# Patient Record
Sex: Female | Born: 1975 | Race: Black or African American | Hispanic: No | Marital: Married | Smoking: Never smoker
Health system: Southern US, Community
[De-identification: ages and names within clinical notes are randomized; demographics above are authoritative.]

## PROBLEM LIST (undated history)

## (undated) DIAGNOSIS — K219 Gastro-esophageal reflux disease without esophagitis: Secondary | ICD-10-CM

## (undated) DIAGNOSIS — R202 Paresthesia of skin: Secondary | ICD-10-CM

## (undated) DIAGNOSIS — I10 Essential (primary) hypertension: Secondary | ICD-10-CM

## (undated) DIAGNOSIS — J309 Allergic rhinitis, unspecified: Secondary | ICD-10-CM

## (undated) DIAGNOSIS — J45909 Unspecified asthma, uncomplicated: Secondary | ICD-10-CM

## (undated) DIAGNOSIS — R7303 Prediabetes: Secondary | ICD-10-CM

## (undated) DIAGNOSIS — Z6838 Body mass index (BMI) 38.0-38.9, adult: Secondary | ICD-10-CM

## (undated) DIAGNOSIS — F419 Anxiety disorder, unspecified: Secondary | ICD-10-CM

## (undated) DIAGNOSIS — E611 Iron deficiency: Secondary | ICD-10-CM

## (undated) DIAGNOSIS — B009 Herpesviral infection, unspecified: Secondary | ICD-10-CM

## (undated) DIAGNOSIS — E559 Vitamin D deficiency, unspecified: Secondary | ICD-10-CM

## (undated) DIAGNOSIS — M109 Gout, unspecified: Secondary | ICD-10-CM

## (undated) DIAGNOSIS — F32A Depression, unspecified: Secondary | ICD-10-CM

## (undated) DIAGNOSIS — F329 Major depressive disorder, single episode, unspecified: Secondary | ICD-10-CM

## (undated) DIAGNOSIS — Z789 Other specified health status: Secondary | ICD-10-CM

## (undated) DIAGNOSIS — T783XXA Angioneurotic edema, initial encounter: Secondary | ICD-10-CM

## (undated) DIAGNOSIS — G44229 Chronic tension-type headache, not intractable: Secondary | ICD-10-CM

## (undated) DIAGNOSIS — E669 Obesity, unspecified: Secondary | ICD-10-CM

## (undated) HISTORY — DX: Vitamin D deficiency, unspecified: E55.9

## (undated) HISTORY — DX: Chronic tension-type headache, not intractable: G44.229

## (undated) HISTORY — DX: Allergic rhinitis, unspecified: J30.9

## (undated) HISTORY — DX: Essential (primary) hypertension: I10

## (undated) HISTORY — DX: Prediabetes: R73.03

## (undated) HISTORY — DX: Gastro-esophageal reflux disease without esophagitis: K21.9

## (undated) HISTORY — DX: Angioneurotic edema, initial encounter: T78.3XXA

## (undated) HISTORY — PX: OTHER SURGICAL HISTORY: SHX169

## (undated) HISTORY — DX: Gout, unspecified: M10.9

## (undated) HISTORY — DX: Paresthesia of skin: R20.2

## (undated) HISTORY — DX: Unspecified asthma, uncomplicated: J45.909

## (undated) HISTORY — DX: Obesity, unspecified: E66.9

## (undated) HISTORY — PX: TONSILLECTOMY: SUR1361

## (undated) HISTORY — DX: Herpesviral infection, unspecified: B00.9

## (undated) HISTORY — DX: Iron deficiency: E61.1

## (undated) HISTORY — DX: Anxiety disorder, unspecified: F41.9

## (undated) HISTORY — DX: Body mass index (BMI) 38.0-38.9, adult: Z68.38

## (undated) HISTORY — PX: NO PAST SURGERIES: SHX2092

## (undated) HISTORY — DX: Major depressive disorder, single episode, unspecified: F32.9

## (undated) HISTORY — DX: Depression, unspecified: F32.A

---

## 1998-06-08 ENCOUNTER — Encounter: Payer: Self-pay | Admitting: Emergency Medicine

## 1998-06-08 ENCOUNTER — Emergency Department (HOSPITAL_COMMUNITY): Admission: EM | Admit: 1998-06-08 | Discharge: 1998-06-08 | Payer: Self-pay | Admitting: Emergency Medicine

## 2000-11-04 ENCOUNTER — Encounter: Payer: Self-pay | Admitting: Emergency Medicine

## 2000-11-04 ENCOUNTER — Emergency Department (HOSPITAL_COMMUNITY): Admission: EM | Admit: 2000-11-04 | Discharge: 2000-11-04 | Payer: Self-pay | Admitting: Emergency Medicine

## 2003-09-11 ENCOUNTER — Other Ambulatory Visit: Admission: RE | Admit: 2003-09-11 | Discharge: 2003-09-11 | Payer: Self-pay | Admitting: Internal Medicine

## 2004-06-05 ENCOUNTER — Encounter: Admission: RE | Admit: 2004-06-05 | Discharge: 2004-06-05 | Payer: Self-pay | Admitting: Obstetrics and Gynecology

## 2004-06-13 ENCOUNTER — Inpatient Hospital Stay (HOSPITAL_COMMUNITY): Admission: AD | Admit: 2004-06-13 | Discharge: 2004-06-13 | Payer: Self-pay | Admitting: Obstetrics and Gynecology

## 2004-06-25 ENCOUNTER — Inpatient Hospital Stay (HOSPITAL_COMMUNITY): Admission: AD | Admit: 2004-06-25 | Discharge: 2004-06-25 | Payer: Self-pay | Admitting: Obstetrics and Gynecology

## 2004-07-05 ENCOUNTER — Inpatient Hospital Stay (HOSPITAL_COMMUNITY): Admission: AD | Admit: 2004-07-05 | Discharge: 2004-07-08 | Payer: Self-pay | Admitting: Obstetrics and Gynecology

## 2004-12-28 ENCOUNTER — Other Ambulatory Visit: Admission: RE | Admit: 2004-12-28 | Discharge: 2004-12-28 | Payer: Self-pay | Admitting: Internal Medicine

## 2006-02-22 ENCOUNTER — Other Ambulatory Visit: Admission: RE | Admit: 2006-02-22 | Discharge: 2006-02-22 | Payer: Self-pay | Admitting: Obstetrics and Gynecology

## 2007-07-21 ENCOUNTER — Other Ambulatory Visit: Admission: RE | Admit: 2007-07-21 | Discharge: 2007-07-21 | Payer: Self-pay | Admitting: Obstetrics and Gynecology

## 2008-03-01 ENCOUNTER — Encounter: Admission: RE | Admit: 2008-03-01 | Discharge: 2008-03-01 | Payer: Self-pay | Admitting: Obstetrics and Gynecology

## 2008-07-23 ENCOUNTER — Other Ambulatory Visit: Admission: RE | Admit: 2008-07-23 | Discharge: 2008-07-23 | Payer: Self-pay | Admitting: Obstetrics and Gynecology

## 2008-08-07 ENCOUNTER — Encounter: Admission: RE | Admit: 2008-08-07 | Discharge: 2008-08-07 | Payer: Self-pay | Admitting: Internal Medicine

## 2008-11-17 ENCOUNTER — Ambulatory Visit: Payer: Self-pay | Admitting: Diagnostic Radiology

## 2008-11-17 ENCOUNTER — Emergency Department (HOSPITAL_BASED_OUTPATIENT_CLINIC_OR_DEPARTMENT_OTHER): Admission: EM | Admit: 2008-11-17 | Discharge: 2008-11-17 | Payer: Self-pay | Admitting: Emergency Medicine

## 2009-08-21 ENCOUNTER — Other Ambulatory Visit: Admission: RE | Admit: 2009-08-21 | Discharge: 2009-08-21 | Payer: Self-pay | Admitting: Obstetrics and Gynecology

## 2009-12-12 ENCOUNTER — Encounter: Admission: RE | Admit: 2009-12-12 | Discharge: 2009-12-12 | Payer: Self-pay | Admitting: Internal Medicine

## 2010-08-14 NOTE — H&P (Signed)
Laura Schaefer, Laura Schaefer                 ACCOUNT NO.:  [id number]   MEDICAL RECORD NO.:  [id number]          PATIENT TYPE:  INP   LOCATION:  9165                          FACILITY:  [facility]   PHYSICIAN:  Juan H. Lily Peer, M.D.DATE OF BIRTH:  1976/01/03   DATE OF ADMISSION:  07/05/2004  DATE OF DISCHARGE:                                HISTORY & PHYSICAL   CHIEF COMPLAINT:  Contractions.   HISTORY:  The patient is a 35 year old gravida 3 para 1 AB1 whose last  menstrual period was October 21, 2003.  Estimated date of confinement July 04, 2004.  Currently, 40/[redacted] weeks gestation.  She presented to [facility]  this evening complaining of contractions at approximately 21 hours on April  9.  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  She denies any  recent outbreaks in the last trimester.  She declined first trimester  screening.  The patient is with a history of asthma.  She was taking  Singulair 10 mg daily as well as albuterol p.r.n. and also iron  supplementation for anemia.  Otherwise, had an uneventful prenatal course.  The patient is also with positive group B strep culture.   PAST MEDICAL HISTORY:  The patient is allergic to penicillin.  She has had  one normal spontaneous vaginal delivery in May of 1994 of 6 pounds 12 ounce  female delivered vaginally.  In 1998, she had first trimester mixed AB.  Also, the patient is with a history of asthma and also a history of anemia  and history of HSV.   REVIEW OF SYSTEMS:  See hospital form.   PHYSICAL EXAMINATION:  Blood pressure 122/72, pulse 122, temperature 97.2.  HEENT unremarkable.  Neck is supple.  Trachea midline with no carotid bruits  and no thyromegaly.  Lungs are clear to auscultation without rhonchi or  wheezes.  Heart:  Regular rate and rhythm with no murmurs or gallops.  Breast exam not done.  Abdomen:  Gravid uterus.  Vertex  presentation.  Pelvic exam:  Cervix 5-6 cm, bulging membrane, 90% effaced, -3 station.  Extremities:  DTRs are 1+, negative clonus, trace edema.   PRENATAL LABS:  She is O positive blood type, negative antibody screen,  sickle cell trait negative.  VDRL nonreactive.  Rubella immune.  Hepatitis B  surface antigen and HIV were negative.  GBS culture was positive.  GC and  chlamydia culture was negative.  Pap smear was negative.  One hour PC was  normal.   ASSESSMENT:  A 35 year old gravida 3 para 1 AB 1 at 40-2/[redacted] weeks gestation  in active labor, contracting every three to four minutes and positive,  reassuring fetal heart rate tracing, advanced cervical dilatation.  Underwent artificial rupture of membranes with good amniotic fluid and with  scalp electrode an IUPC was placed.  Prior to rupture of membranes, the  patient had been started on clindamycin IV secondary to positive GBS status.  The patient is with a history of HSV.  No outbreak  was reported in the third  trimester and was on prophylaxis of Valtrex 500 mg  daily during the third trimester of her pregnancy.  The patient is with a  history of asthma.  No recent asthmatic attacks reported.   PLAN:  Anticipate vaginal delivery.  Augment with Pitocin if indicated.  Epidural upon request.      JHF/MEDQ  D:  07/06/2004  T:  07/06/2004  Job:  [id number]   cc:   Leonette Most A. Sydnee Cabal, MD  Fax: [phone]

## 2010-09-14 ENCOUNTER — Other Ambulatory Visit (HOSPITAL_COMMUNITY)
Admission: RE | Admit: 2010-09-14 | Discharge: 2010-09-14 | Disposition: A | Discharge status: Home / Self Care | Payer: BC Managed Care – PPO | Source: Ambulatory Visit | Attending: Obstetrics and Gynecology | Admitting: Obstetrics and Gynecology

## 2010-09-14 ENCOUNTER — Other Ambulatory Visit: Payer: Self-pay | Admitting: Obstetrics and Gynecology

## 2010-09-14 DIAGNOSIS — Z01419 Encounter for gynecological examination (general) (routine) without abnormal findings: Secondary | ICD-10-CM | POA: Insufficient documentation

## 2010-09-14 DIAGNOSIS — Z113 Encounter for screening for infections with a predominantly sexual mode of transmission: Secondary | ICD-10-CM | POA: Insufficient documentation

## 2010-09-14 LAB — ABO/RH: RH Type: POSITIVE

## 2010-09-14 LAB — RPR: RPR: NONREACTIVE

## 2010-09-14 LAB — HIV ANTIBODY (ROUTINE TESTING W REFLEX): HIV: NONREACTIVE

## 2010-10-20 ENCOUNTER — Inpatient Hospital Stay (HOSPITAL_COMMUNITY)
Admission: AD | Admit: 2010-10-20 | Discharge: 2010-10-20 | Disposition: A | Discharge status: Home / Self Care | Payer: BC Managed Care – PPO | Source: Ambulatory Visit | Attending: Obstetrics and Gynecology | Admitting: Obstetrics and Gynecology

## 2010-10-20 ENCOUNTER — Encounter (HOSPITAL_COMMUNITY): Payer: Self-pay

## 2010-10-20 ENCOUNTER — Inpatient Hospital Stay (HOSPITAL_COMMUNITY): Payer: BC Managed Care – PPO

## 2010-10-20 DIAGNOSIS — O418X90 Other specified disorders of amniotic fluid and membranes, unspecified trimester, not applicable or unspecified: Secondary | ICD-10-CM

## 2010-10-20 DIAGNOSIS — O468X9 Other antepartum hemorrhage, unspecified trimester: Secondary | ICD-10-CM | POA: Insufficient documentation

## 2010-10-20 DIAGNOSIS — O200 Threatened abortion: Secondary | ICD-10-CM | POA: Insufficient documentation

## 2010-10-20 LAB — CBC
MCH: 23.1 pg — ABNORMAL LOW (ref 26.0–34.0)
MCHC: 32.2 g/dL (ref 30.0–36.0)
MCV: 71.8 fL — ABNORMAL LOW (ref 78.0–100.0)
Platelets: 307 10*3/uL (ref 150–400)
RDW: 15.1 % (ref 11.5–15.5)

## 2010-10-20 LAB — HCG, QUANTITATIVE, PREGNANCY: hCG, Beta Chain, Quant, S: 120917 m[IU]/mL — ABNORMAL HIGH (ref <5)

## 2010-10-20 NOTE — Progress Notes (Signed)
Pt states she had a sudden onset of bright red bleeding at about 1645. No pain.

## 2010-10-20 NOTE — ED Provider Notes (Signed)
History     Chief Complaint  Patient presents with  . Vaginal Bleeding   HPI  Presents with c/o red bleeding. Had U/S this week which showed viable IUP with probably [facility]  Past Medical History  Diagnosis Date  . Asthma     Past Surgical History  Procedure Date  . Toncillectomy     Family History  Problem Relation Age of Onset  . Hypertension Mother   . Hypertension Father   . Diabetes Sister   . Hypertension Sister   . Diabetes Paternal Grandmother   . Hypertension Paternal Grandmother   . Cancer Paternal Grandfather     History  Substance Use Topics  . Smoking status: Not on file  . Smokeless tobacco: Not on file  . Alcohol Use:     Allergies:  Allergies  Allergen Reactions  . Penicillins Hives    Patient states she has never taken penicillin but has been told this by her mother who confirms this at bedside    Prescriptions prior to admission  Medication Sig Dispense Refill  . acetaminophen (TYLENOL) 500 MG tablet Take 1,000 mg by mouth every 6 (six) hours as needed. Patient took medication for pain.       Marland Kitchen albuterol (PROVENTIL,VENTOLIN) 90 MCG/ACT inhaler Inhale 2 puffs into the lungs every 6 (six) hours as needed.        . Magnesium Hydroxide (MILK OF MAGNESIA PO) Take 5 mLs by mouth daily as needed. Patient used medication for constipation.       Marland Kitchen OVER THE COUNTER MEDICATION Take 1 Package by mouth daily as needed. Patient tried this herbal supplement for constipation.       . Pediatric Multiple Vitamins (FLINTSTONES MULTIVITAMIN PO) Take 1 tablet by mouth daily.        . psyllium (METAMUCIL) 58.6 % powder Take 1 packet by mouth daily as needed. Patient used this medication for constipation.         ROS Physical Exam   Blood pressure 125/83, pulse 102, temperature 98.5 F (36.9 C), temperature source Oral, resp. rate 16, height 5' 7.5" (1.715 m), weight 207 lb 6.4 oz (94.076 kg), SpO2 99.00%.  Physical Exam Speculum exam done.  Grape sized clot  observed and removed, no active bleeding from cervical os. Cervix closed Uterus appropriate size, nontender No adnexal tenderness.   MAU Course  Procedures Speculum Exam  Assessment and Plan  First trimester bleeding Viable IUP with known Subchorionic Hemorrhage Dr  Chilton Si updated. Will followup in office as scheduled Pelvic rest.  Wynelle Bourgeois 10/20/2010, 8:19 PM

## 2010-11-06 ENCOUNTER — Inpatient Hospital Stay (HOSPITAL_COMMUNITY)
Admission: AD | Admit: 2010-11-06 | Discharge: 2010-11-06 | Disposition: A | Discharge status: Home / Self Care | Payer: BC Managed Care – PPO | Source: Ambulatory Visit | Attending: Obstetrics and Gynecology | Admitting: Obstetrics and Gynecology

## 2010-11-06 ENCOUNTER — Inpatient Hospital Stay (HOSPITAL_COMMUNITY): Payer: BC Managed Care – PPO

## 2010-11-06 ENCOUNTER — Encounter (HOSPITAL_COMMUNITY): Payer: Self-pay

## 2010-11-06 DIAGNOSIS — O209 Hemorrhage in early pregnancy, unspecified: Secondary | ICD-10-CM

## 2010-11-06 HISTORY — DX: Other specified health status: Z78.9

## 2010-11-06 LAB — URINALYSIS, ROUTINE W REFLEX MICROSCOPIC
Bilirubin Urine: NEGATIVE
Ketones, ur: NEGATIVE mg/dL
Nitrite: NEGATIVE
Specific Gravity, Urine: >1.03 — ABNORMAL HIGH (ref 1.005–1.030)
Urobilinogen, UA: 1 mg/dL (ref 0.0–1.0)

## 2010-11-06 LAB — URINE MICROSCOPIC-ADD ON

## 2010-11-06 LAB — CBC
Hemoglobin: 9.2 g/dL — ABNORMAL LOW (ref 12.0–15.0)
MCV: 73.6 fL — ABNORMAL LOW (ref 78.0–100.0)
Platelets: 306 10*3/uL (ref 150–400)
RBC: 3.9 MIL/uL (ref 3.87–5.11)
WBC: 12 10*3/uL — ABNORMAL HIGH (ref 4.0–10.5)

## 2010-11-06 NOTE — ED Provider Notes (Addendum)
History   Pt presents today c/o vag bleeding that began about 3pm today. She states the last episode of intercourse was in June. She has a hx of bleeding during this preg and had a recent NL [place] in July. She denies any abd pain or vag irritation.   Chief Complaint  Patient presents with  . Vaginal Bleeding   HPI  OB History    Grav Para Term Preterm Abortions TAB SAB Ect Mult Living   4 2 2  0 1 1 0 0 0 2      Past Medical History  Diagnosis Date  . Asthma   . No pertinent past medical history     Past Surgical History  Procedure Date  . Toncillectomy   . Tonsillectomy   . No past surgeries     Family History  Problem Relation Age of Onset  . Hypertension Mother   . Hypertension Father   . Diabetes Sister   . Hypertension Sister   . Diabetes Paternal Grandmother   . Hypertension Paternal Grandmother   . Cancer Paternal Grandfather     History  Substance Use Topics  . Smoking status: Never Smoker   . Smokeless tobacco: Not on file  . Alcohol Use: No    Allergies:  Allergies  Allergen Reactions  . Penicillins Hives    Patient states she has never taken penicillin but has been told this by her mother who confirms this at bedside    Prescriptions prior to admission  Medication Sig Dispense Refill  . acetaminophen (TYLENOL) 500 MG tablet Take 1,000 mg by mouth every 6 (six) hours as needed. Patient took medication for pain.       Marland Kitchen albuterol (PROVENTIL,VENTOLIN) 90 MCG/ACT inhaler Inhale 2 puffs into the lungs every 6 (six) hours as needed.        . Pediatric Multiple Vitamins (FLINTSTONES MULTIVITAMIN PO) Take 1 tablet by mouth daily.        . polyethylene glycol powder (MIRALAX) powder Take 17 g by mouth every Monday, Wednesday, and Friday.          Review of Systems  Constitutional: Negative for fever.  Gastrointestinal: Negative for nausea, vomiting, abdominal pain, diarrhea and constipation.  Genitourinary: Negative for dysuria, urgency, frequency and  hematuria.  Musculoskeletal: Positive for back pain.  Neurological: Negative for dizziness and headaches.  Psychiatric/Behavioral: Negative for depression and suicidal ideas.   Physical Exam   Blood pressure 129/68, pulse 96, temperature 98.2 F (36.8 C), temperature source Oral, resp. rate 20, height 5\' 7"  (1.702 m), weight 209 lb 12.8 oz (95.165 kg).  Physical Exam  Constitutional: She is oriented to person, place, and time. She appears well-developed and well-nourished. No distress.  HENT:  Head: Normocephalic and atraumatic.  Eyes: EOM are normal. Pupils are equal, round, and reactive to light.  GI: Soft. She exhibits no distension. There is no tenderness. There is no rebound and no guarding.  Genitourinary: There is bleeding around the vagina. No vaginal discharge (minimal amount of dark red blood noted on exam. Cervix is Lg/closed.) found.  Neurological: She is alert and oriented to person, place, and time.  Skin: Skin is warm and dry. She is not diaphoretic.  Psychiatric: She has a normal mood and affect. Her behavior is normal. Thought content normal.    MAU Course  Procedures  Results for orders placed during the hospital encounter of 11/06/10 (from the past 24 hour(s))  URINALYSIS, ROUTINE W REFLEX MICROSCOPIC     Status:  Abnormal   Collection Time   11/06/10  3:56 PM      Component Value Range   Color, Urine YELLOW  YELLOW    Appearance CLEAR  CLEAR    Specific Gravity, Urine >1.030 (*) 1.005 - 1.030    pH 6.0  5.0 - 8.0    Glucose, UA NEGATIVE  NEGATIVE (mg/dL)   Hgb urine dipstick SMALL (*) NEGATIVE    Bilirubin Urine NEGATIVE  NEGATIVE    Ketones, ur NEGATIVE  NEGATIVE (mg/dL)   Protein, ur NEGATIVE  NEGATIVE (mg/dL)   Urobilinogen, UA 1.0  0.0 - 1.0 (mg/dL)   Nitrite NEGATIVE  NEGATIVE    Leukocytes, UA NEGATIVE  NEGATIVE   URINE MICROSCOPIC-ADD ON     Status: Abnormal   Collection Time   11/06/10  3:56 PM      Component Value Range   Squamous Epithelial /  LPF FEW (*) RARE    RBC / HPF 0-2  <3 (RBC/hpf)   Bacteria, UA FEW (*) RARE    Urine-Other MUCOUS PRESENT    CBC     Status: Abnormal   Collection Time   11/06/10  6:26 PM      Component Value Range   WBC 12.0 (*) 4.0 - 10.5 (K/uL)   RBC 3.90  3.87 - 5.11 (MIL/uL)   Hemoglobin 9.2 (*) 12.0 - 15.0 (g/dL)   HCT 16.1 (*) 09.6 - 46.0 (%)   MCV 73.6 (*) 78.0 - 100.0 (fL)   MCH 23.6 (*) 26.0 - 34.0 (pg)   MCHC 32.1  30.0 - 36.0 (g/dL)   RDW 04.5 (*) 40.9 - 15.5 (%)   Platelets 306  150 - 400 (K/uL)   US shows anterior placenta with no abruption or previa noted. Cervix has a normal appearance.  Pt discussed with Dr. Richardson Dopp at length.  Assessment and Plan  Bleeding in preg: discussed with pt at length. She is to avoid intercourse. She has f/u scheduled with Dr. Richardson Dopp next week. Discussed diet, activity, risks, and precautions.  Clinton Gallant. Judea Fennimore III, DrHSc, MPAS, PA-C  11/06/2010, 7:07 PM   Henrietta Hoover, PA 11/06/10 1912

## 2010-11-06 NOTE — Progress Notes (Signed)
Pt states "bright vag bleeding since 3pm today, now blding is decreasing"

## 2010-11-06 NOTE — Progress Notes (Signed)
Pt states she was seen in MAU 3 weeks ago for bleeding, Could not find a reason for the bleeding. Had a regular appointment with Dr. Richardson Dopp this am and OK. Sudden onset of bright red blood about one hour ago with a few small blood clots. Pt has on a mini pad with a small amount of dark red blood. No free bleeding noted. Pt denies any pain at this time.

## 2011-03-30 NOTE — L&D Delivery Note (Signed)
Delivery Note At 2:37 AM a viable female was delivered via Vaginal, Spontaneous Delivery (Presentation: Middle Occiput Anterior).  APGAR: 9, 9; weight .   Placenta status: Intact, Spontaneous.  Cord: 3 vessels with the following complications: None.  Cord pH: NA  Anesthesia: Epidural  Episiotomy: None Lacerations: None Suture Repair: NA Est. Blood Loss (mL): 300  Mom to postpartum.  Baby to nursery-stable.  Jovanka Westgate J. 04/27/2011, 3:00 AM

## 2011-04-15 LAB — STREP B DNA PROBE: GBS: POSITIVE

## 2011-04-23 ENCOUNTER — Telehealth (HOSPITAL_COMMUNITY): Payer: Self-pay | Admitting: *Deleted

## 2011-04-23 ENCOUNTER — Encounter (HOSPITAL_COMMUNITY): Payer: Self-pay | Admitting: *Deleted

## 2011-04-23 NOTE — Telephone Encounter (Signed)
Preadmission screen  

## 2011-04-26 ENCOUNTER — Inpatient Hospital Stay (HOSPITAL_COMMUNITY): Payer: BC Managed Care – PPO | Admitting: Anesthesiology

## 2011-04-26 ENCOUNTER — Inpatient Hospital Stay (HOSPITAL_COMMUNITY)
Admission: AD | Admit: 2011-04-26 | Discharge: 2011-04-29 | DRG: 373 | Disposition: A | Discharge status: Home / Self Care | Payer: BC Managed Care – PPO | Attending: Obstetrics and Gynecology | Admitting: Obstetrics and Gynecology

## 2011-04-26 ENCOUNTER — Encounter (HOSPITAL_COMMUNITY): Payer: Self-pay | Admitting: Anesthesiology

## 2011-04-26 ENCOUNTER — Encounter (HOSPITAL_COMMUNITY): Payer: Self-pay | Admitting: *Deleted

## 2011-04-26 DIAGNOSIS — Z22330 Carrier of Group B streptococcus: Secondary | ICD-10-CM

## 2011-04-26 DIAGNOSIS — O99892 Other specified diseases and conditions complicating childbirth: Principal | ICD-10-CM | POA: Diagnosis present

## 2011-04-26 DIAGNOSIS — O09529 Supervision of elderly multigravida, unspecified trimester: Secondary | ICD-10-CM | POA: Diagnosis present

## 2011-04-26 LAB — CBC
Hemoglobin: 10.4 g/dL — ABNORMAL LOW (ref 12.0–15.0)
MCH: 23.6 pg — ABNORMAL LOW (ref 26.0–34.0)
MCV: 74.3 fL — ABNORMAL LOW (ref 78.0–100.0)
Platelets: 231 10*3/uL (ref 150–400)
RBC: 4.4 MIL/uL (ref 3.87–5.11)
WBC: 11.8 10*3/uL — ABNORMAL HIGH (ref 4.0–10.5)

## 2011-04-26 MED ORDER — ACETAMINOPHEN 325 MG PO TABS: 650.0000 mg | ORAL_TABLET | ORAL | Status: DC | PRN

## 2011-04-26 MED ORDER — OXYCODONE-ACETAMINOPHEN 5-325 MG PO TABS: 1.0000 | ORAL_TABLET | ORAL | Status: DC | PRN

## 2011-04-26 MED ORDER — OXYTOCIN BOLUS FROM INFUSION
500.0000 mL | Freq: Once | INTRAVENOUS | Status: AC
  Administered 2011-04-27: 500 mL via INTRAVENOUS
  Filled 2011-04-26: qty 500
  Filled 2011-04-26: qty 1000

## 2011-04-26 MED ORDER — FENTANYL 2.5 MCG/ML BUPIVACAINE 1/10 % EPIDURAL INFUSION (WH - ANES)
INTRAMUSCULAR | Status: DC | PRN
  Administered 2011-04-26: 14 mL/h via EPIDURAL

## 2011-04-26 MED ORDER — ONDANSETRON HCL 4 MG/2ML IJ SOLN
4.0000 mg | Freq: Four times a day (QID) | INTRAMUSCULAR | Status: DC | PRN
  Administered 2011-04-27: 4 mg via INTRAVENOUS
  Filled 2011-04-26: qty 2

## 2011-04-26 MED ORDER — FENTANYL 2.5 MCG/ML BUPIVACAINE 1/10 % EPIDURAL INFUSION (WH - ANES)
14.0000 mL/h | INTRAMUSCULAR | Status: DC
  Administered 2011-04-27: 14 mL/h via EPIDURAL
  Filled 2011-04-26 (×2): qty 60

## 2011-04-26 MED ORDER — BUTORPHANOL TARTRATE 2 MG/ML IJ SOLN: 1.0000 mg | INTRAMUSCULAR | Status: DC | PRN

## 2011-04-26 MED ORDER — IBUPROFEN 600 MG PO TABS
600.0000 mg | ORAL_TABLET | Freq: Four times a day (QID) | ORAL | Status: DC | PRN
  Administered 2011-04-27: 600 mg via ORAL
  Filled 2011-04-26: qty 1

## 2011-04-26 MED ORDER — LIDOCAINE HCL (PF) 1 % IJ SOLN
30.0000 mL | INTRAMUSCULAR | Status: DC | PRN
  Filled 2011-04-26: qty 30

## 2011-04-26 MED ORDER — LACTATED RINGERS IV SOLN
500.0000 mL | Freq: Once | INTRAVENOUS | Status: AC
  Administered 2011-04-26: 500 mL via INTRAVENOUS

## 2011-04-26 MED ORDER — EPHEDRINE 5 MG/ML INJ: 10.0000 mg | INTRAVENOUS | Status: DC | PRN

## 2011-04-26 MED ORDER — FLEET ENEMA 7-19 GM/118ML RE ENEM: 1.0000 | ENEMA | RECTAL | Status: DC | PRN

## 2011-04-26 MED ORDER — LIDOCAINE HCL 1.5 % IJ SOLN
INTRAMUSCULAR | Status: DC | PRN
  Administered 2011-04-26 (×2): 4 mL via EPIDURAL

## 2011-04-26 MED ORDER — EPHEDRINE 5 MG/ML INJ
10.0000 mg | INTRAVENOUS | Status: DC | PRN
  Filled 2011-04-26: qty 4

## 2011-04-26 MED ORDER — CITRIC ACID-SODIUM CITRATE 334-500 MG/5ML PO SOLN: 30.0000 mL | ORAL | Status: DC | PRN

## 2011-04-26 MED ORDER — LACTATED RINGERS IV SOLN
INTRAVENOUS | Status: DC
  Administered 2011-04-26 (×2): via INTRAVENOUS

## 2011-04-26 MED ORDER — LACTATED RINGERS IV SOLN
500.0000 mL | INTRAVENOUS | Status: DC | PRN
  Administered 2011-04-27: 500 mL via INTRAVENOUS

## 2011-04-26 MED ORDER — PHENYLEPHRINE 40 MCG/ML (10ML) SYRINGE FOR IV PUSH (FOR BLOOD PRESSURE SUPPORT)
80.0000 ug | PREFILLED_SYRINGE | INTRAVENOUS | Status: DC | PRN
  Administered 2011-04-27: 80 ug via INTRAVENOUS
  Filled 2011-04-26: qty 5

## 2011-04-26 MED ORDER — CLINDAMYCIN PHOSPHATE 900 MG/50ML IV SOLN
900.0000 mg | Freq: Three times a day (TID) | INTRAVENOUS | Status: DC
  Administered 2011-04-26: 900 mg via INTRAVENOUS
  Filled 2011-04-26 (×3): qty 50

## 2011-04-26 MED ORDER — OXYTOCIN 20 UNITS IN LACTATED RINGERS INFUSION - SIMPLE: 125.0000 mL/h | Freq: Once | INTRAVENOUS | Status: DC

## 2011-04-26 MED ORDER — DIPHENHYDRAMINE HCL 50 MG/ML IJ SOLN: 12.5000 mg | INTRAMUSCULAR | Status: DC | PRN

## 2011-04-26 MED ORDER — PHENYLEPHRINE 40 MCG/ML (10ML) SYRINGE FOR IV PUSH (FOR BLOOD PRESSURE SUPPORT): 80.0000 ug | PREFILLED_SYRINGE | INTRAVENOUS | Status: DC | PRN

## 2011-04-26 NOTE — Anesthesia Procedure Notes (Signed)
Epidural Patient location during procedure: OB Start time: 04/26/2011 11:40 PM  Preanesthetic Checklist Completed: patient identified, site marked, surgical consent, pre-op evaluation, timeout performed, IV checked, risks and benefits discussed and monitors and equipment checked  Epidural Patient position: sitting Prep: site prepped and draped and DuraPrep Patient monitoring: continuous pulse ox and blood pressure Approach: midline Injection technique: LOR air  Needle:  Needle type: Tuohy  Needle gauge: 17 G Needle length: 9 cm Needle insertion depth: 7 cm Catheter type: closed end flexible Catheter size: 19 Gauge Catheter at skin depth: 12 cm Test dose: negative and 1.5% lidocaine  Assessment Events: blood not aspirated, injection not painful, no injection resistance, negative IV test and no paresthesia  Additional Notes Patient identified. Risks and benefits discussed including failed block, incomplete  Pain control, post dural puncture headache, nerve damage, paralysis, blood pressure Changes, nausea, vomiting, reactions to medications-both toxic and allergic and post Partum back pain. All questions were answered. Patient expressed understanding and wished to proceed. Sterile technique was used throughout procedure. Epidural site was Dressed with sterile barrier dressing. No paresthesias, signs of intravascular injection Or signs of intrathecal spread were encountered.  Patient was more comfortable after the epidural was dosed. Please see RN's note for documentation of vital signs and FHR which are stable.

## 2011-04-26 NOTE — Progress Notes (Signed)
Jenean Lindau, PA at bedside.  Assessed for HSV outbreak.  None seen.

## 2011-04-26 NOTE — Progress Notes (Signed)
PT SAYS  UC STARTED  AT 7 PM-   2 WEEKS AGO- VE IN OFFICE 2 CM.     PT CONCERN IS - SHE  HAS HX - HSV- HAS BEEN TAKING VALTREX  3 WEEKS - BUT  THIS MORNING  SAW A BUMP ON PERINEUM- THINKS ITS AN OUTBREAK.

## 2011-04-26 NOTE — Plan of Care (Signed)
Problem: Consults Goal: Birthing Suites Patient Information Press F2 to bring up selections list  Outcome: Completed/Met Date Met:  04/26/11  Pt 37-[redacted] weeks EGA

## 2011-04-26 NOTE — Progress Notes (Signed)
Pt will go to room 170.  Will call when pt can come.

## 2011-04-26 NOTE — Progress Notes (Signed)
Notified of VE and ctx pattern.  Admit orders received.  

## 2011-04-26 NOTE — Anesthesia Preprocedure Evaluation (Signed)
Anesthesia Evaluation  Patient identified by MRN, date of birth, ID band Patient awake    Reviewed: Allergy & Precautions, H&P , Patient's Chart, lab work & pertinent test results  Airway Mallampati: III TM Distance: >3 FB Neck ROM: full    Dental No notable dental hx. (+) Teeth Intact   Pulmonary asthma ,  clear to auscultation  Pulmonary exam normal       Cardiovascular neg cardio ROS regular Normal    Neuro/Psych PSYCHIATRIC DISORDERS Negative Neurological ROS     GI/Hepatic negative GI ROS, Neg liver ROS,   Endo/Other  Morbid obesity  Renal/GU negative Renal ROS  Genitourinary negative   Musculoskeletal   Abdominal Normal abdominal exam  (+)   Peds  Hematology negative hematology ROS (+)   Anesthesia Other Findings   Reproductive/Obstetrics (+) Pregnancy                           Anesthesia Physical Anesthesia Plan  ASA: III  Anesthesia Plan: Epidural   Post-op Pain Management:    Induction:   Airway Management Planned:   Additional Equipment:   Intra-op Plan:   Post-operative Plan:   Informed Consent: I have reviewed the patients History and Physical, chart, labs and discussed the procedure including the risks, benefits and alternatives for the proposed anesthesia with the patient or authorized representative who has indicated his/her understanding and acceptance.     Plan Discussed with: Anesthesiologist and Surgeon  Anesthesia Plan Comments:         Anesthesia Quick Evaluation

## 2011-04-27 ENCOUNTER — Encounter (HOSPITAL_COMMUNITY): Payer: Self-pay

## 2011-04-27 LAB — CBC
MCV: 74.4 fL — ABNORMAL LOW (ref 78.0–100.0)
Platelets: 196 10*3/uL (ref 150–400)
RDW: 17.5 % — ABNORMAL HIGH (ref 11.5–15.5)
WBC: 12 10*3/uL — ABNORMAL HIGH (ref 4.0–10.5)

## 2011-04-27 LAB — ABO/RH: ABO/RH(D): O POS

## 2011-04-27 MED ORDER — DIBUCAINE 1 % RE OINT
1.0000 "application " | TOPICAL_OINTMENT | RECTAL | Status: DC | PRN
  Administered 2011-04-28: 1 via RECTAL
  Filled 2011-04-27: qty 28

## 2011-04-27 MED ORDER — ZOLPIDEM TARTRATE 5 MG PO TABS: 5.0000 mg | ORAL_TABLET | Freq: Every evening | ORAL | Status: DC | PRN

## 2011-04-27 MED ORDER — IBUPROFEN 100 MG/5ML PO SUSP
600.0000 mg | Freq: Four times a day (QID) | ORAL | Status: DC
  Administered 2011-04-27 – 2011-04-29 (×9): 600 mg via ORAL
  Filled 2011-04-27 (×13): qty 30

## 2011-04-27 MED ORDER — OXYCODONE-ACETAMINOPHEN 5-325 MG PO TABS
1.0000 | ORAL_TABLET | ORAL | Status: DC | PRN
  Administered 2011-04-27 (×3): 2 via ORAL
  Administered 2011-04-28: 1 via ORAL
  Administered 2011-04-28: 2 via ORAL
  Administered 2011-04-28: 1 via ORAL
  Filled 2011-04-27 (×2): qty 2
  Filled 2011-04-27 (×2): qty 1
  Filled 2011-04-27 (×2): qty 2

## 2011-04-27 MED ORDER — OXYTOCIN 20 UNITS IN LACTATED RINGERS INFUSION - SIMPLE
1.0000 m[IU]/min | INTRAVENOUS | Status: DC
Start: 2011-04-27 — End: 2011-04-27

## 2011-04-27 MED ORDER — FERROUS SULFATE 325 (65 FE) MG PO TABS
325.0000 mg | ORAL_TABLET | Freq: Two times a day (BID) | ORAL | Status: DC
  Administered 2011-04-27 – 2011-04-29 (×5): 325 mg via ORAL
  Filled 2011-04-27 (×5): qty 1

## 2011-04-27 MED ORDER — TETANUS-DIPHTH-ACELL PERTUSSIS 5-2.5-18.5 LF-MCG/0.5 IM SUSP
0.5000 mL | Freq: Once | INTRAMUSCULAR | Status: AC
  Administered 2011-04-28: 0.5 mL via INTRAMUSCULAR
  Filled 2011-04-27: qty 0.5

## 2011-04-27 MED ORDER — SENNOSIDES-DOCUSATE SODIUM 8.6-50 MG PO TABS
2.0000 | ORAL_TABLET | Freq: Every day | ORAL | Status: DC
  Administered 2011-04-27 – 2011-04-28 (×2): 2 via ORAL

## 2011-04-27 MED ORDER — WITCH HAZEL-GLYCERIN EX PADS
1.0000 "application " | MEDICATED_PAD | CUTANEOUS | Status: DC | PRN
  Administered 2011-04-28: 1 via TOPICAL

## 2011-04-27 MED ORDER — IBUPROFEN 600 MG PO TABS: 600.0000 mg | ORAL_TABLET | Freq: Four times a day (QID) | ORAL | Status: DC

## 2011-04-27 MED ORDER — MEDROXYPROGESTERONE ACETATE 150 MG/ML IM SUSP: 150.0000 mg | INTRAMUSCULAR | Status: DC | PRN

## 2011-04-27 MED ORDER — TERBUTALINE SULFATE 1 MG/ML IJ SOLN: 0.2500 mg | Freq: Once | INTRAMUSCULAR | Status: DC | PRN

## 2011-04-27 MED ORDER — DIPHENHYDRAMINE HCL 25 MG PO CAPS: 25.0000 mg | ORAL_CAPSULE | Freq: Four times a day (QID) | ORAL | Status: DC | PRN

## 2011-04-27 MED ORDER — ALBUTEROL 90 MCG/ACT IN AERS
2.0000 | INHALATION_SPRAY | Freq: Four times a day (QID) | RESPIRATORY_TRACT | Status: DC | PRN
  Filled 2011-04-27: qty 2

## 2011-04-27 MED ORDER — PRENATAL MULTIVITAMIN CH
1.0000 | ORAL_TABLET | Freq: Every day | ORAL | Status: DC
  Filled 2011-04-27 (×2): qty 1

## 2011-04-27 MED ORDER — SIMETHICONE 80 MG PO CHEW
80.0000 mg | CHEWABLE_TABLET | ORAL | Status: DC | PRN
  Administered 2011-04-27: 80 mg via ORAL

## 2011-04-27 MED ORDER — LANOLIN HYDROUS EX OINT: TOPICAL_OINTMENT | CUTANEOUS | Status: DC | PRN

## 2011-04-27 MED ORDER — ONDANSETRON HCL 4 MG/2ML IJ SOLN: 4.0000 mg | INTRAMUSCULAR | Status: DC | PRN

## 2011-04-27 MED ORDER — BENZOCAINE-MENTHOL 20-0.5 % EX AERO: 1.0000 "application " | INHALATION_SPRAY | CUTANEOUS | Status: DC | PRN

## 2011-04-27 MED ORDER — ONDANSETRON HCL 4 MG PO TABS: 4.0000 mg | ORAL_TABLET | ORAL | Status: DC | PRN

## 2011-04-27 NOTE — Progress Notes (Signed)
UR Chart review completed.  

## 2011-04-27 NOTE — H&P (Signed)
Laura Schaefer is a 36 y.o. female  Presenting at 38 wks and 6 days with EDD 05/05/2011 c/o of contraction q 5 minutes. 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 Asthma   . No pertinent past medical history   . 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 Herpes    Past Surgical History  Procedure Date  . Toncillectomy   . Tonsillectomy   . No past surgeries    Family History: family history includes Cancer in her paternal grandfather; Diabetes in her paternal grandmother and sister; and Hypertension in her father, mother, paternal grandmother, and sister. Social History:  reports that she has never smoked. She has never used smokeless tobacco. She reports that she does not drink alcohol or use illicit drugs.  ROS negative except as stated in HPI  Dilation: 5 Effacement (%): 90 Station: -1 Exam by:: dr Richardson Dopp Blood pressure 128/83, pulse 115, temperature 98.7 F (37.1 C), temperature source Oral, resp. rate 20, height 5\' 6"  (1.676 m), weight 103.193 kg (227 lb 8 oz), SpO2 100.00%. CV rrr Lungs clear  abd gravid nontender  Ext 1+ edema  Pelvic examm perineum examined no ulcers noted... cx 5/90/-1.Marland Kitchen Arom clear fluid  FHR baseline 140's good btbv +Accels no decels .Marland Kitchen Toco ctx q 3-4 minutes   Prenatal labs: ABO, Rh: O/Positive/-- (06/18 0000) Antibody: Negative (06/18 0000) Rubella:   RPR: Nonreactive (06/18 0000)  HBsAg: Negative (06/18 0000)  HIV: Non-reactive (06/18 0000)  GBS: Positive (01/17 0000)   Assessment/Plan: 38 wks and 6 days labor  Clindamycin for GBS prophylaxis  Anticipate SVD    Capitola Ladson J. 04/27/2011, 12:38 AM

## 2011-04-27 NOTE — Progress Notes (Signed)
04/27/11 1100  Clinical Encounter Type  Visited With Patient and family together (FOB and close friend present.)  Visit Type Initial  Referral From Nurse  Spiritual Encounters  Spiritual Needs (Witness to story and to joy.)    Laura Schaefer was in joyful spirits and reported little physical discomfort, feeling relief at seeing her baby do well.  Provided pastoral presence and encouragement.  Pt is aware of ongoing chaplain availability, including in NICU.  Avis Epley, [place] Chaplain  [phone]

## 2011-04-27 NOTE — Progress Notes (Signed)
PSYCHOSOCIAL ASSESSMENT ~ MATERNAL/CHILD Name: Laura Schaefer                                                                                                     Age: 36   Referral Date: 04/27/11 Reason/Source: NICU Support/NICU  I. FAMILY/HOME ENVIRONMENT Child's Legal Guardian _x__Parent(s) ___Grandparent ___Foster parent ___DSS_________________ Name: Laura Schaefer                                          DOB:                        Age: 58  Address: [address] [place], [place] [address]  Name: Laura Schaefer                                          DOB:                       Age:   Address: lives nearby  Other Household Members/Support Persons Name:                                         Relationship: MGM               DOB ___/___/___                   Name: Laura Schaefer                               Relationship: (sister)            DOB ___/___/___                   Name:                                         Relationship:                        DOB ___/___/___                   Name:                                         Relationship:                        DOB ___/___/___  C. 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 [employer], FOB works for [employer] _x_Medicaid    [employer]: [employer]                _x_Private Insurance: BCBS                   __Self Pay  __Food Stamps   __WIC __Work First     __Public Housing     __Section 8    __Maternity Care Coordination/Child Service Coordination/Early Intervention  __School:                                                                         Grade:  __Other:   Insurance risk surveyor and Environment Information Cultural Issues Impacting Care: none known  STRENGTHS _x__Supportive  family/friends _x__Adequate Resources _x__Compliance with medical plan _x__Home prepared for Child (including basic supplies) _x__Understanding of illness      _x__Other: Pediatric follow up will be at [facility] Peds RISK FACTORS AND CURRENT PROBLEMS         __x__No Problems Noted                                                                                                                                                                                                                                       Pt              Family     Substance Abuse                                                                ___              ___        Mental Illness  ___              ___  Family/Relationship Issues                                      ___               ___             Abuse/Neglect/Domestic Violence                                         ___         ___  Financial Resources                                        ___              ___             Transportation                                                                        ___               ___  DSS Involvement                                                                   ___              ___  Adjustment to Illness                                                               ___              ___  Knowledge/Cognitive Deficit                                                   ___              ___             Compliance with Treatment                                                 ___                ___  Basic Needs (food, housing, etc.)                                          ___              ___             Housing Concerns                                       ___              ___ Other_____________________________________________________________            SOCIAL WORK ASSESSMENT  SW met with MOB in her third floor room to introduce myself, complete assessment and  evaluate how family is coping with baby's admission to NICU.  MOB was extremely friendly and upbeat.  She states that she is feeling well and she thinks baby is about the same as this morning.  She seems to have a good understanding of the situation and was appropriately sad that baby would have to stay in the hospital for at least 7 days, but also appropriately stated that she just wants to make sure baby is okay before discharge.  She states that she will not have any issues with transportation after her d/c.  She had questions about visitation and other things to expect from the NICU experience, which SW addressed.  She especially questioned the current policy of not allowing children under 12 in to visit.  SW explained why and she was very understanding, but sad for her daughter who is excited about her baby sister.  SW validated feelings and suggested that she could possibly use her smart phone in order for her daughter to see her baby sister.  SW also informed [employer] of this so that a sibling package can be put together for sister.  MOB states that she and FOB do not live together, but are in a relationship and he is involved and supportive.  She states that he is the father of her 51 year old as well.  MOB has an 37 year old daughter who is in college at [employer], but comes home frequently.  MOB told SW that she and her 37 year old live with her mother and that they have a good support system.  She reports having everything she needs for baby at home.  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  SW informed MOB of support services offered by NICU SWs.  She reports no needs at this time and seemed very appreciative of SW's visit.    SOCIAL WORK PLAN  ___No Further Intervention Required/No Barriers to Discharge   _x__Psychosocial Support and Ongoing Assessment of Needs   ___Patient/Family Education:   ___Child Protective Services  Report   County___________ Date___/____/____   ___Information/Referral to [employer]_________________________   ___Other:

## 2011-04-28 NOTE — Progress Notes (Signed)
Post Partum Day 1 s/p vaginal delivery  Subjective: no complaints, up ad lib, voiding and tolerating PO  Objective: Blood pressure 122/79, pulse 80, temperature 98 F (36.7 C), temperature source Oral, resp. rate 18, height 5\' 6"  (1.676 m), weight 103.193 kg (227 lb 8 oz), SpO2 100.00%, unknown if currently breastfeeding.  Physical Exam:  General: alert and cooperative Lochia: appropriate Uterine Fundus: firm Incision: NA DVT Evaluation: No evidence of DVT seen on physical exam.   Basename 04/27/11 0535 04/26/11 2140  HGB 8.4* 10.4*  HCT 26.5* 32.7*    Assessment/Plan: Plan for discharge tomorrow and Breastfeeding   LOS: 2 days   Audrianna Driskill J. 04/28/2011, 2:38 PM

## 2011-04-28 NOTE — Anesthesia Postprocedure Evaluation (Signed)
  Anesthesia Post-op Note  Patient: Laura Schaefer  Procedure(s) Performed: * No procedures listed *  Patient Location: PACU and Women[facility] Unit  Anesthesia Type: Epidural  Level of Consciousness: awake, alert  and oriented  Airway and Oxygen Therapy: Patient Spontanous Breathing  Post-op Pain: none  Post-op Assessment: Post-op Vital signs reviewed  Post-op Vital Signs: Reviewed and stable  Complications: No apparent anesthesia complications

## 2011-04-29 MED ORDER — IBUPROFEN 100 MG/5ML PO SUSP: 600.0000 mg | Freq: Four times a day (QID) | ORAL | Status: DC

## 2011-04-29 NOTE — Progress Notes (Signed)
Post Partum Day 2 Subjective: no complaints, up ad lib, voiding, tolerating PO and + flatus  Objective: Blood pressure 115/73, pulse 87, temperature 97.3 F (36.3 C), temperature source Oral, resp. rate 18, height 5\' 6"  (1.676 m), weight 97.24 kg (214 lb 6 oz), SpO2 100.00%, unknown if currently breastfeeding.  Physical Exam:  General: alert and cooperative Lochia: appropriate Uterine Fundus: firm Incision: NA DVT Evaluation: No evidence of DVT seen on physical exam.   Basename 04/27/11 0535 04/26/11 2140  HGB 8.4* 10.4*  HCT 26.5* 32.7*    Assessment/Plan: Discharge home and Contraception partner had a vasectomy    LOS: 3 days   Ephram Kornegay J. 04/29/2011, 8:40 AM

## 2011-04-29 NOTE — Discharge Summary (Signed)
Obstetric Discharge Summary Reason for Admission: onset of labor Prenatal Procedures: none Intrapartum Procedures: spontaneous vaginal delivery Postpartum Procedures: none Complications-Operative and Postpartum: none Hemoglobin  Date Value Range Status  04/27/2011 8.4* 12.0-15.0 (g/dL) Final     DELTA CHECK NOTED     REPEATED TO VERIFY     HCT  Date Value Range Status  04/27/2011 26.5* 36.0-46.0 (%) Final    Discharge Diagnoses: Term Pregnancy-delivered  Discharge Information: Date: 04/29/2011 Activity: pelvic rest Diet: routine Medications: PNV, Ibuprofen and Iron Condition: stable Instructions: refer to practice specific booklet Discharge to: home Follow-up Information    Follow up with Jessee Avers., MD. Schedule an appointment as soon as possible for a visit in 2 weeks. (post partum visit to evaluate for depresion )    Contact information:   [address] [place] [address] [phone]          Newborn Data: Live born female  Birth Weight: 8 lb 11.7 oz (3960 g) APGAR: 9, 9  Baby in NICU with respiratory infection   Morris Markham J. 04/29/2011, 8:43 AM

## 2011-04-29 NOTE — Progress Notes (Signed)
DC instructions reviewed for mom and baby. Pt states complete understanding. With no questions nor concerns. Walked with mom to main entrance. Pt stable.

## 2011-05-04 ENCOUNTER — Inpatient Hospital Stay (HOSPITAL_COMMUNITY): Admission: RE | Admit: 2011-05-04 | Payer: BC Managed Care – PPO | Source: Ambulatory Visit

## 2011-05-11 ENCOUNTER — Other Ambulatory Visit: Payer: Self-pay | Admitting: Obstetrics and Gynecology

## 2011-05-11 ENCOUNTER — Ambulatory Visit
Admission: RE | Admit: 2011-05-11 | Discharge: 2011-05-11 | Disposition: A | Discharge status: Home / Self Care | Payer: BC Managed Care – PPO | Source: Ambulatory Visit | Attending: Obstetrics and Gynecology | Admitting: Obstetrics and Gynecology

## 2011-05-11 DIAGNOSIS — R609 Edema, unspecified: Secondary | ICD-10-CM

## 2011-09-20 ENCOUNTER — Other Ambulatory Visit (HOSPITAL_COMMUNITY)
Admission: RE | Admit: 2011-09-20 | Discharge: 2011-09-20 | Disposition: A | Discharge status: Home / Self Care | Payer: BC Managed Care – PPO | Source: Ambulatory Visit | Attending: Obstetrics and Gynecology | Admitting: Obstetrics and Gynecology

## 2011-09-20 ENCOUNTER — Other Ambulatory Visit: Payer: Self-pay | Admitting: Obstetrics and Gynecology

## 2011-09-20 DIAGNOSIS — Z01419 Encounter for gynecological examination (general) (routine) without abnormal findings: Secondary | ICD-10-CM | POA: Insufficient documentation

## 2011-09-20 DIAGNOSIS — Z1151 Encounter for screening for human papillomavirus (HPV): Secondary | ICD-10-CM | POA: Insufficient documentation

## 2011-09-20 DIAGNOSIS — N760 Acute vaginitis: Secondary | ICD-10-CM | POA: Insufficient documentation

## 2012-08-25 ENCOUNTER — Other Ambulatory Visit: Payer: Self-pay | Admitting: Internal Medicine

## 2012-08-25 DIAGNOSIS — M79605 Pain in left leg: Secondary | ICD-10-CM

## 2012-08-25 DIAGNOSIS — M7989 Other specified soft tissue disorders: Secondary | ICD-10-CM

## 2012-08-28 ENCOUNTER — Ambulatory Visit
Admission: RE | Admit: 2012-08-28 | Discharge: 2012-08-28 | Disposition: A | Discharge status: Home / Self Care | Payer: BC Managed Care – PPO | Source: Ambulatory Visit | Attending: Internal Medicine | Admitting: Internal Medicine

## 2012-08-28 DIAGNOSIS — M7989 Other specified soft tissue disorders: Secondary | ICD-10-CM

## 2012-08-28 DIAGNOSIS — M79605 Pain in left leg: Secondary | ICD-10-CM

## 2012-09-21 ENCOUNTER — Other Ambulatory Visit (HOSPITAL_COMMUNITY)
Admission: RE | Admit: 2012-09-21 | Discharge: 2012-09-21 | Disposition: A | Discharge status: Home / Self Care | Payer: BC Managed Care – PPO | Source: Ambulatory Visit | Attending: Obstetrics and Gynecology | Admitting: Obstetrics and Gynecology

## 2012-09-21 ENCOUNTER — Other Ambulatory Visit: Payer: Self-pay | Admitting: Obstetrics and Gynecology

## 2012-09-21 DIAGNOSIS — Z01419 Encounter for gynecological examination (general) (routine) without abnormal findings: Secondary | ICD-10-CM | POA: Insufficient documentation

## 2012-10-04 ENCOUNTER — Institutional Professional Consult (permissible substitution): Payer: BC Managed Care – PPO | Admitting: Internal Medicine

## 2012-11-20 ENCOUNTER — Institutional Professional Consult (permissible substitution): Payer: BC Managed Care – PPO | Admitting: Internal Medicine

## 2012-11-23 ENCOUNTER — Encounter: Payer: Self-pay | Admitting: Internal Medicine

## 2013-10-09 ENCOUNTER — Other Ambulatory Visit: Payer: Self-pay | Admitting: Obstetrics and Gynecology

## 2013-10-09 ENCOUNTER — Other Ambulatory Visit (HOSPITAL_COMMUNITY)
Admission: RE | Admit: 2013-10-09 | Discharge: 2013-10-09 | Disposition: A | Discharge status: Home / Self Care | Payer: BC Managed Care – PPO | Source: Ambulatory Visit | Attending: Obstetrics and Gynecology | Admitting: Obstetrics and Gynecology

## 2013-10-09 DIAGNOSIS — Z01419 Encounter for gynecological examination (general) (routine) without abnormal findings: Secondary | ICD-10-CM | POA: Insufficient documentation

## 2013-10-10 LAB — CYTOLOGY - PAP

## 2014-01-28 ENCOUNTER — Encounter (HOSPITAL_COMMUNITY): Payer: Self-pay

## 2014-04-08 ENCOUNTER — Ambulatory Visit
Admission: RE | Admit: 2014-04-08 | Discharge: 2014-04-08 | Disposition: A | Discharge status: Home / Self Care | Payer: BC Managed Care – PPO | Source: Ambulatory Visit | Attending: Internal Medicine | Admitting: Internal Medicine

## 2014-04-08 ENCOUNTER — Other Ambulatory Visit: Payer: Self-pay | Admitting: Internal Medicine

## 2014-04-08 DIAGNOSIS — R0781 Pleurodynia: Secondary | ICD-10-CM

## 2015-07-25 ENCOUNTER — Other Ambulatory Visit: Payer: Self-pay

## 2015-07-25 DIAGNOSIS — Z1231 Encounter for screening mammogram for malignant neoplasm of breast: Secondary | ICD-10-CM

## 2015-07-31 ENCOUNTER — Other Ambulatory Visit (HOSPITAL_COMMUNITY)
Admission: RE | Admit: 2015-07-31 | Discharge: 2015-07-31 | Disposition: A | Discharge status: Home / Self Care | Payer: BC Managed Care – PPO | Source: Ambulatory Visit | Attending: Obstetrics and Gynecology | Admitting: Obstetrics and Gynecology

## 2015-07-31 ENCOUNTER — Other Ambulatory Visit: Payer: Self-pay | Admitting: Obstetrics and Gynecology

## 2015-07-31 DIAGNOSIS — Z01419 Encounter for gynecological examination (general) (routine) without abnormal findings: Secondary | ICD-10-CM | POA: Insufficient documentation

## 2015-07-31 DIAGNOSIS — Z1151 Encounter for screening for human papillomavirus (HPV): Secondary | ICD-10-CM | POA: Insufficient documentation

## 2015-08-04 LAB — CYTOLOGY - PAP

## 2015-08-08 ENCOUNTER — Ambulatory Visit
Admission: RE | Admit: 2015-08-08 | Discharge: 2015-08-08 | Disposition: A | Discharge status: Home / Self Care | Payer: Managed Care, Other (non HMO) | Source: Ambulatory Visit

## 2015-08-08 ENCOUNTER — Ambulatory Visit: Payer: BC Managed Care – PPO

## 2015-08-08 DIAGNOSIS — Z1231 Encounter for screening mammogram for malignant neoplasm of breast: Secondary | ICD-10-CM

## 2015-08-22 ENCOUNTER — Ambulatory Visit

## 2015-08-22 ENCOUNTER — Ambulatory Visit (INDEPENDENT_AMBULATORY_CARE_PROVIDER_SITE_OTHER): Admitting: Urgent Care

## 2015-08-22 ENCOUNTER — Ambulatory Visit (INDEPENDENT_AMBULATORY_CARE_PROVIDER_SITE_OTHER)

## 2015-08-22 VITALS — BP 136/86 | HR 66 | Temp 98.4°F | Resp 18 | Ht 69.0 in | Wt 212.6 lb

## 2015-08-22 DIAGNOSIS — S40012A Contusion of left shoulder, initial encounter: Secondary | ICD-10-CM

## 2015-08-22 DIAGNOSIS — M25572 Pain in left ankle and joints of left foot: Secondary | ICD-10-CM

## 2015-08-22 DIAGNOSIS — S8002XA Contusion of left knee, initial encounter: Secondary | ICD-10-CM | POA: Diagnosis not present

## 2015-08-22 DIAGNOSIS — M25512 Pain in left shoulder: Secondary | ICD-10-CM

## 2015-08-22 DIAGNOSIS — S80212A Abrasion, left knee, initial encounter: Secondary | ICD-10-CM

## 2015-08-22 DIAGNOSIS — M25472 Effusion, left ankle: Secondary | ICD-10-CM | POA: Diagnosis not present

## 2015-08-22 DIAGNOSIS — S72415A Nondisplaced unspecified condyle fracture of lower end of left femur, initial encounter for closed fracture: Secondary | ICD-10-CM

## 2015-08-22 DIAGNOSIS — W108XXA Fall (on) (from) other stairs and steps, initial encounter: Secondary | ICD-10-CM | POA: Diagnosis not present

## 2015-08-22 DIAGNOSIS — M25562 Pain in left knee: Secondary | ICD-10-CM

## 2015-08-22 MED ORDER — HYDROCODONE-ACETAMINOPHEN 7.5-325 MG/15ML PO SOLN: 10.0000 mL | Freq: Four times a day (QID) | ORAL | Status: DC | PRN

## 2015-08-22 MED ORDER — HYDROCODONE-ACETAMINOPHEN 5-325 MG PO TABS: 1.0000 | ORAL_TABLET | Freq: Four times a day (QID) | ORAL | Status: DC | PRN

## 2015-08-22 MED ORDER — NAPROXEN SODIUM 550 MG PO TABS: 550.0000 mg | ORAL_TABLET | Freq: Two times a day (BID) | ORAL | Status: DC

## 2015-08-22 MED ORDER — CYCLOBENZAPRINE HCL 5 MG PO TABS: 5.0000 mg | ORAL_TABLET | Freq: Three times a day (TID) | ORAL | Status: DC | PRN

## 2015-08-22 NOTE — Progress Notes (Signed)
MRN: [id number] DOB: [birth year]  Subjective:   Laura Schaefer is a 40 y.o. female presenting for chief complaint of Ankle Injury and Shoulder Injury  Reports suffering a fall while at work today. 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 Patient fell onto her left side and made impact with her left knee, left arm onto pavement. She has since had ankle swelling, knee pain, left shoulder pain. She is having trouble lifting her left shoulder/arm, has slight knee swelling. She also had left elbow and wrist pain which is now improved. Has not tried any medications for pain relief. Denies bony deformity, redness, bruising, bleeding, hearing popping or tearing noises. Patient did not hit head, did not lose consciousness, denies confusion, double vision, dizziness, back pain.  Laura Schaefer's medications list, allergies, past medical history and past surgical history were reviewed and excluded from this note due to being a worker's comp case.  Objective:   Vitals: BP 136/86 mmHg  Pulse 66  Temp(Src) 98.4 F (36.9 C) (Oral)  Resp 18  Ht 5\' 9"  (1.753 m)  Wt 212 lb 9.6 oz (96.435 kg)  BMI 31.38 kg/m2  SpO2 99%  LMP 08/04/2015  Physical Exam  Constitutional: She is oriented to person, place, and time. She appears well-developed and well-nourished.  HENT:  Mouth/Throat: Oropharynx is clear and moist.  Eyes: EOM are normal. Pupils are equal, round, and reactive to light.  Neck: Normal range of motion. Neck supple.  Cardiovascular: Normal rate, regular rhythm and intact distal pulses.  Exam reveals no gallop and no friction rub.   No murmur heard. Pulmonary/Chest: No respiratory distress. She has no wheezes. She has no rales.  Musculoskeletal:       Left shoulder: She exhibits decreased range of motion (abduction, external rotation), tenderness (over AC joint) and spasm (over left trapezius). She exhibits no swelling, no effusion, no crepitus, no deformity, no  laceration and normal strength.       Left elbow: She exhibits normal range of motion, no swelling, no effusion, no deformity and no laceration. No tenderness found.       Left wrist: She exhibits normal range of motion, no tenderness, no bony tenderness, no swelling, no effusion, no crepitus, no deformity and no laceration.       Left knee: She exhibits swelling (trace). She exhibits normal range of motion, no effusion, no ecchymosis, no deformity, no laceration, no erythema, normal alignment and normal patellar mobility. Tenderness found. Patellar tendon tenderness noted. No medial joint line, no lateral joint line, no MCL and no LCL tenderness noted.       Cervical back: She exhibits normal range of motion, no tenderness, no bony tenderness, no swelling, no edema, no deformity, no laceration and no spasm.       Thoracic back: She exhibits normal range of motion, no tenderness, no bony tenderness, no swelling, no edema, no deformity, no laceration and no spasm.       Lumbar back: She exhibits normal range of motion, no tenderness, no bony tenderness, no swelling, no edema, no deformity, no laceration and no spasm.       Legs: Neurological: She is alert and oriented to person, place, and time.  Skin: Skin is warm and dry.   Dg Ankle Complete Left  08/22/2015  CLINICAL DATA:  Left ankle pain following a fall. EXAM: LEFT ANKLE COMPLETE - 3+ VIEW COMPARISON:  Left foot dated 11/17/2008. FINDINGS: Diffuse soft tissue swelling. Previously  demonstrated unfused ossicles or bone fragments dorsal to the navicular. No acute fracture or dislocation. No effusion. Mild calcaneal spur formation. IMPRESSION: No acute fracture or dislocation. Electronically Signed   By: Beckie Salts M.D.   On: 08/22/2015 17:05   Dg Shoulder Left  08/22/2015  CLINICAL DATA:  Left shoulder pain, fall EXAM: LEFT SHOULDER - 2+ VIEW COMPARISON:  None. FINDINGS: Three views of the left shoulder submitted. No acute fracture or subluxation.  No radiopaque foreign body. AC joint and glenohumeral joint are preserved IMPRESSION: Negative. Electronically Signed   By: Natasha Mead M.D.   On: 08/22/2015 17:03   Dg Knee Complete 4 Views Left  08/22/2015  CLINICAL DATA:  Fall. EXAM: LEFT KNEE - COMPLETE 4+ VIEW COMPARISON:  None. FINDINGS: Tiny bony densities noted along the anterior femoral condylar region on sunrise view. Tiny chip fracture cannot be excluded. No other focal abnormality identified. Tibial plateau is intact. IMPRESSION: Tiny chip fracture noted along the anterior aspect of the femoral condylar region on sunrise view cannot be completely excluded. Exam is otherwise negative. Electronically Signed   By: Maisie Fus  Register   On: 08/22/2015 17:04   Assessment and Plan :   1. Fall down steps, initial encounter 2. Left knee pain 3. Knee abrasion, left, initial encounter 4. Knee contusion, left, initial encounter 5. Left shoulder pain 6. Shoulder contusion, left, initial encounter 7. Ankle pain, left 8. Left ankle swelling 9. Closed non-displaced fracture of condyle of left femur, initial encounter - Will place leg in immobilizer, uses crutches to ambulate as necessary. Should be non-weight bearing. Use pain medication, NSAID, muscle relaxant. Work restrictions provided, rtc in 5 days for recheck. Repeat x-ray at that point. Patient is in agreement.  Wallis Bamberg, PA-C [facility] [phone] 08/22/2015 4:06 PM

## 2015-08-22 NOTE — Patient Instructions (Addendum)
Contusion A contusion is a deep bruise. Contusions are the result of a blunt injury to tissues and muscle fibers under the skin. The injury causes bleeding under the skin. The skin overlying the contusion may turn blue, purple, or yellow. Minor injuries will give you a painless contusion, but more severe contusions may stay painful and swollen for a few weeks.  CAUSES  This condition is usually caused by a blow, trauma, or direct force to an area of the body. SYMPTOMS  Symptoms of this condition include:  Swelling of the injured area.  Pain and tenderness in the injured area.  Discoloration. The area may have redness and then turn blue, purple, or yellow. DIAGNOSIS  This condition is diagnosed based on a physical exam and medical history. An X-ray, CT scan, or MRI may be needed to determine if there are any associated injuries, such as broken bones (fractures). TREATMENT  Specific treatment for this condition depends on what area of the body was injured. In general, the best treatment for a contusion is resting, icing, applying pressure to (compression), and elevating the injured area. This is often called the RICE strategy. Over-the-counter anti-inflammatory medicines may also be recommended for pain control.  HOME CARE INSTRUCTIONS   Rest the injured area.  If directed, apply ice to the injured area:  Put ice in a plastic bag.  Place a towel between your skin and the bag.  Leave the ice on for 20 minutes, 2-3 times per day.  If directed, apply light compression to the injured area using an elastic bandage. Make sure the bandage is not wrapped too tightly. Remove and reapply the bandage as directed by your health care provider.  If possible, raise (elevate) the injured area above the level of your heart while you are sitting or lying down.  Take over-the-counter and prescription medicines only as told by your health care provider. SEEK MEDICAL CARE IF:  Your symptoms do not  improve after several days of treatment.  Your symptoms get worse.  You have difficulty moving the injured area. SEEK IMMEDIATE MEDICAL CARE IF:   You have severe pain.  You have numbness in a hand or foot.  Your hand or foot turns pale or cold.   This information is not intended to replace advice given to you by your health care provider. Make sure you discuss any questions you have with your health care provider.   Document Released: 12/23/2004 Document Revised: 12/04/2014 Document Reviewed: 07/31/2014 Elsevier Interactive Patient Education 2016 [employer]    RICE for Routine Care of Injuries Theroutine careofmanyinjuriesincludes rest, ice, compression, and elevation (RICE therapy). RICE therapy is often recommended for injuries to soft tissues, such as a muscle strain, ligament injuries, bruises, and overuse injuries. It can also be used for some bony injuries. Using RICE therapy can help to relieve pain, lessen swelling, and enable your body to heal. Rest Rest is required to allow your body to heal. This usually involves reducing your normal activities and avoiding use of the injured part of your body. Generally, you can return to your normal activities when you are comfortable and have been given permission by your health care provider. Ice Icing your injury helps to keep the swelling down, and it lessens pain. Do not apply ice directly to your skin.  Put ice in a plastic bag.  Place a towel between your skin and the bag.  Leave the ice on for 20 minutes, 2-3 times a day. Do this for as  long as you are directed by your health care provider. Compression Compression means putting pressure on the injured area. Compression helps to keep swelling down, gives support, and helps with discomfort. Compression may be done with an elastic bandage. If an elastic bandage has been applied, follow these general tips:  Remove and reapply the bandage every 3-4 hours or as directed by  your health care provider.  Make sure the bandage is not wrapped too tightly, because this can cut off circulation. If part of your body beyond the bandage becomes blue, numb, cold, swollen, or more painful, your bandage is most likely too tight. If this occurs, remove your bandage and reapply it more loosely.  See your health care provider if the bandage seems to be making your problems worse rather than better. Elevation Elevation means keeping the injured area raised. This helps to lessen swelling and decrease pain. If possible, your injured area should be elevated at or above the level of your heart or the center of your chest. WHEN SHOULD I SEEK MEDICAL CARE? You should seek medical care if:  Your pain and swelling continue.  Your symptoms are getting worse rather than improving. These symptoms may indicate that further evaluation or further X-rays are needed. Sometimes, X-rays may not show a small broken bone (fracture) until a number of days later. Make a follow-up appointment with your health care provider. WHEN SHOULD I SEEK IMMEDIATE MEDICAL CARE? You should seek immediate medical care if:  You have sudden severe pain at or below the area of your injury.  You have redness or increased swelling around your injury.  You have tingling or numbness at or below the area of your injury that does not improve after you remove the elastic bandage.   This information is not intended to replace advice given to you by your health care provider. Make sure you discuss any questions you have with your health care provider.   Document Released: 06/27/2000 Document Revised: 12/04/2014 Document Reviewed: 02/20/2014 Elsevier Interactive Patient Education 2016 [employer]     IF you received an x-ray today, you will receive an invoice from [facility]. Please contact [facility] at [phone] with questions or concerns regarding your invoice.   IF you received labwork  today, you will receive an invoice from [facility]. Please contact [facility] at [phone] with questions or concerns regarding your invoice.   Our billing staff will not be able to assist you with questions regarding bills from these companies.  You will be contacted with the lab results as soon as they are available. The fastest way to get your results is to activate your My Chart account. Instructions are located on the last page of this paperwork. If you have not heard from us regarding the results in 2 weeks, please contact this office.

## 2015-08-27 ENCOUNTER — Ambulatory Visit (INDEPENDENT_AMBULATORY_CARE_PROVIDER_SITE_OTHER): Admitting: Urgent Care

## 2015-08-27 ENCOUNTER — Ambulatory Visit

## 2015-08-27 VITALS — BP 118/80 | HR 68 | Temp 98.0°F | Resp 18 | Ht 69.0 in | Wt 217.0 lb

## 2015-08-27 DIAGNOSIS — M25562 Pain in left knee: Secondary | ICD-10-CM

## 2015-08-27 DIAGNOSIS — S8002XD Contusion of left knee, subsequent encounter: Secondary | ICD-10-CM | POA: Diagnosis not present

## 2015-08-27 DIAGNOSIS — W108XXD Fall (on) (from) other stairs and steps, subsequent encounter: Secondary | ICD-10-CM

## 2015-08-27 DIAGNOSIS — S80212D Abrasion, left knee, subsequent encounter: Secondary | ICD-10-CM

## 2015-08-27 DIAGNOSIS — M25512 Pain in left shoulder: Secondary | ICD-10-CM

## 2015-08-27 DIAGNOSIS — S46912D Strain of unspecified muscle, fascia and tendon at shoulder and upper arm level, left arm, subsequent encounter: Secondary | ICD-10-CM

## 2015-08-27 DIAGNOSIS — M25572 Pain in left ankle and joints of left foot: Secondary | ICD-10-CM

## 2015-08-27 DIAGNOSIS — S93402D Sprain of unspecified ligament of left ankle, subsequent encounter: Secondary | ICD-10-CM

## 2015-08-27 NOTE — Patient Instructions (Addendum)
Knee Pain Knee pain is a very common symptom and can have many causes. Knee pain often goes away when you follow your health care provider's instructions for relieving pain and discomfort at home. However, knee pain can develop into a condition that needs treatment. Some conditions may include:  Arthritis caused by wear and tear (osteoarthritis).  Arthritis caused by swelling and irritation (rheumatoid arthritis or gout).  A cyst or growth in your knee.  An infection in your knee joint.  An injury that will not heal.  Damage, swelling, or irritation of the tissues that support your knee (torn ligaments or tendinitis). If your knee pain continues, additional tests may be ordered to diagnose your condition. Tests may include X-rays or other imaging studies of your knee. You may also need to have fluid removed from your knee. Treatment for ongoing knee pain depends on the cause, but treatment may include:  Medicines to relieve pain or swelling.  Steroid injections in your knee.  Physical therapy.  Surgery. HOME CARE INSTRUCTIONS  Take medicines only as directed by your health care provider.  Rest your knee and keep it raised (elevated) while you are resting.  Do not do things that cause or worsen pain.  Avoid high-impact activities or exercises, such as running, jumping rope, or doing jumping jacks.  Apply ice to the knee area:  Put ice in a plastic bag.  Place a towel between your skin and the bag.  Leave the ice on for 20 minutes, 2-3 times a day.  Ask your health care provider if you should wear an elastic knee support.  Keep a pillow under your knee when you sleep.  Lose weight if you are overweight. Extra weight can put pressure on your knee.  Do not use any tobacco products, including cigarettes, chewing tobacco, or electronic cigarettes. If you need help quitting, ask your health care provider. Smoking may slow the healing of any bone and joint problems that you may  have. SEEK MEDICAL CARE IF:  Your knee pain continues, changes, or gets worse.  You have a fever along with knee pain.  Your knee buckles or locks up.  Your knee becomes more swollen. SEEK IMMEDIATE MEDICAL CARE IF:   Your knee joint feels hot to the touch.  You have chest pain or trouble breathing.   This information is not intended to replace advice given to you by your health care provider. Make sure you discuss any questions you have with your health care provider.   Document Released: 01/10/2007 Document Revised: 04/05/2014 Document Reviewed: 10/29/2013 Elsevier Interactive Patient Education 2016 Elsevier Inc.    Shoulder Pain The shoulder is the joint that connects your arms to your body. The bones that form the shoulder joint include the upper arm bone (humerus), the shoulder blade (scapula), and the collarbone (clavicle). The top of the humerus is shaped like a ball and fits into a rather flat socket on the scapula (glenoid cavity). A combination of muscles and strong, fibrous tissues that connect muscles to bones (tendons) support your shoulder joint and hold the ball in the socket. Small, fluid-filled sacs (bursae) are located in different areas of the joint. They act as cushions between the bones and the overlying soft tissues and help reduce friction between the gliding tendons and the bone as you move your arm. Your shoulder joint allows a wide range of motion in your arm. This range of motion allows you to do things like scratch your back or throw a  ball. However, this range of motion also makes your shoulder more prone to pain from overuse and injury. Causes of shoulder pain can originate from both injury and overuse and usually can be grouped in the following four categories:  Redness, swelling, and pain (inflammation) of the tendon (tendinitis) or the bursae (bursitis).  Instability, such as a dislocation of the joint.  Inflammation of the joint (arthritis).  Broken  bone (fracture). HOME CARE INSTRUCTIONS   Apply ice to the sore area.  Put ice in a plastic bag.  Place a towel between your skin and the bag.  Leave the ice on for 15-20 minutes, 3-4 times per day for the first 2 days, or as directed by your health care provider.  Stop using cold packs if they do not help with the pain.  If you have a shoulder sling or immobilizer, wear it as long as your caregiver instructs. Only remove it to shower or bathe. Move your arm as little as possible, but keep your hand moving to prevent swelling.  Squeeze a soft ball or foam pad as much as possible to help prevent swelling.  Only take over-the-counter or prescription medicines for pain, discomfort, or fever as directed by your caregiver. SEEK MEDICAL CARE IF:   Your shoulder pain increases, or new pain develops in your arm, hand, or fingers.  Your hand or fingers become cold and numb.  Your pain is not relieved with medicines. SEEK IMMEDIATE MEDICAL CARE IF:   Your arm, hand, or fingers are numb or tingling.  Your arm, hand, or fingers are significantly swollen or turn white or blue. MAKE SURE YOU:   Understand these instructions.  Will watch your condition.  Will get help right away if you are not doing well or get worse.   This information is not intended to replace advice given to you by your health care provider. Make sure you discuss any questions you have with your health care provider.   Document Released: 12/23/2004 Document Revised: 04/05/2014 Document Reviewed: 07/08/2014 Elsevier Interactive Patient Education 2016 Elsevier Inc.    RICE for Routine Care of Injuries Theroutine careofmanyinjuriesincludes rest, ice, compression, and elevation (RICE therapy). RICE therapy is often recommended for injuries to soft tissues, such as a muscle strain, ligament injuries, bruises, and overuse injuries. It can also be used for some bony injuries. Using RICE therapy can help to relieve  pain, lessen swelling, and enable your body to heal. Rest Rest is required to allow your body to heal. This usually involves reducing your normal activities and avoiding use of the injured part of your body. Generally, you can return to your normal activities when you are comfortable and have been given permission by your health care provider. Ice Icing your injury helps to keep the swelling down, and it lessens pain. Do not apply ice directly to your skin.  Put ice in a plastic bag.  Place a towel between your skin and the bag.  Leave the ice on for 20 minutes, 2-3 times a day. Do this for as long as you are directed by your health care provider. Compression Compression means putting pressure on the injured area. Compression helps to keep swelling down, gives support, and helps with discomfort. Compression may be done with an elastic bandage. If an elastic bandage has been applied, follow these general tips:  Remove and reapply the bandage every 3-4 hours or as directed by your health care provider.  Make sure the bandage is not wrapped too  tightly, because this can cut off circulation. If part of your body beyond the bandage becomes blue, numb, cold, swollen, or more painful, your bandage is most likely too tight. If this occurs, remove your bandage and reapply it more loosely.  See your health care provider if the bandage seems to be making your problems worse rather than better. Elevation Elevation means keeping the injured area raised. This helps to lessen swelling and decrease pain. If possible, your injured area should be elevated at or above the level of your heart or the center of your chest. WHEN SHOULD I SEEK MEDICAL CARE? You should seek medical care if:  Your pain and swelling continue.  Your symptoms are getting worse rather than improving. These symptoms may indicate that further evaluation or further X-rays are needed. Sometimes, X-rays may not show a small broken bone  (fracture) until a number of days later. Make a follow-up appointment with your health care provider. WHEN SHOULD I SEEK IMMEDIATE MEDICAL CARE? You should seek immediate medical care if:  You have sudden severe pain at or below the area of your injury.  You have redness or increased swelling around your injury.  You have tingling or numbness at or below the area of your injury that does not improve after you remove the elastic bandage.   This information is not intended to replace advice given to you by your health care provider. Make sure you discuss any questions you have with your health care provider.   Document Released: 06/27/2000 Document Revised: 12/04/2014 Document Reviewed: 02/20/2014 Elsevier Interactive Patient Education 2016 [employer].     IF you received an x-ray today, you will receive an invoice from [facility]. Please contact [facility] at [phone] with questions or concerns regarding your invoice.   IF you received labwork today, you will receive an invoice from [employer]. Please contact [facility] at [phone] with questions or concerns regarding your invoice.   Our billing staff will not be able to assist you with questions regarding bills from these companies.  You will be contacted with the lab results as soon as they are available. The fastest way to get your results is to activate your My Chart account. Instructions are located on the last page of this paperwork. If you have not heard from [place] regarding the results in 2 weeks, please contact this office.

## 2015-08-27 NOTE — Progress Notes (Signed)
MRN: [id number] DOB: 10/02/1975  Subjective:   Laura HoyerSheila L Schaefer is a 40 y.o. female presenting for Follow-up  Patient suffered a fall onto hard surface while at work on 08/22/2015. Radiographs showed possible tiny chip fracture noted along the anterior aspect of the femoral condylar region on sunrise view. Patient was placed in immobilizer, pain and antiinflammatory medications provided, work restrictions also given. Today, she reports some improvement in her left ankle and knee pain. She can ambulate much better but still has a bit of a limp favoring her left side. She still has difficulty with bending her knee, ankle swelling and intermittent sharp ankle pain. She also still has pain with her shoulder and lifting her arm up above shoulder height. She has only been taking liquid ibuprofen due to difficulty swallowing pills and adverse effects of feeling woozy with hydrocodone. Patient has not used crutches, has tried to keep her leg propped up but admits that she could do better. She has worn the knee immobilizer consistently. Has not used crutches. Denies trauma, redness, warmth, bony deformity.   [name]'s medications list, allergies, past medical history and past surgical history were reviewed and excluded from this note due to being a worker's comp case.  Objective:   Vitals: BP 118/80 mmHg  Pulse 68  Temp(Src) 98 F (36.7 C) (Oral)  Resp 18  Ht 5\' 9"  (1.753 m)  Wt 217 lb (98.431 kg)  BMI 32.03 kg/m2  SpO2 100%  LMP 08/04/2015  Physical Exam  Constitutional: She is oriented to person, place, and time. She appears well-developed and well-nourished.  Cardiovascular: Normal rate.   Pulmonary/Chest: Effort normal.  Musculoskeletal:       Left shoulder: She exhibits decreased range of motion (abduction, external rotation), tenderness (over posterior deltoid, trapezius) and spasm (trapezius). She exhibits no bony tenderness, no swelling, no effusion, no crepitus, no deformity, no  laceration and normal strength.       Left knee: She exhibits normal range of motion, no swelling, no effusion, no ecchymosis, no deformity, no laceration, no erythema, normal alignment, normal patellar mobility and no bony tenderness. Tenderness found. Patellar tendon tenderness noted. No medial joint line, no lateral joint line, no MCL and no LCL tenderness noted.       Left ankle: She exhibits normal range of motion, no swelling, no ecchymosis, no deformity, no laceration and normal pulse. Tenderness. AITFL tenderness found. No lateral malleolus, no medial malleolus, no CF ligament, no posterior TFL, no head of 5th metatarsal and no proximal fibula tenderness found. Achilles tendon exhibits no pain and no defect.  Neurological: She is alert and oriented to person, place, and time.  Skin: Skin is warm and dry.   Dg Knee Complete 4 Views Left  08/27/2015  CLINICAL DATA:  Fall.  Left knee injury. EXAM: LEFT KNEE - COMPLETE 4+ VIEW COMPARISON:  08/22/2015 . FINDINGS: No acute bony or joint abnormality identified. No evidence of fracture dislocation. IMPRESSION: No acute abnormality. Electronically Signed   By: Maisie Fushomas  Register   On: 08/27/2015 11:26   Assessment and Plan :   1. Knee contusion, left, subsequent encounter 2. Left knee pain 3. Knee abrasion, left, subsequent encounter 4. Fall down steps, subsequent encounter 5. Left shoulder strain, subsequent encounter 6. Pain in joint of left shoulder 7. Left ankle sprain, subsequent encounter 8. Left ankle pain - Physical therapy pending, continue liquid ibuprofen. Wear Ace wrap for left knee contusion, [place] for ankle sprain/pain. Work restrictions provided. RTC in 2 weeks  if PT has not yet started. Otherwise, rtc after completion of PT.   Wallis Bamberg, PA-C [facility] [phone] 08/27/2015 10:50 AM

## 2015-09-10 ENCOUNTER — Ambulatory Visit (INDEPENDENT_AMBULATORY_CARE_PROVIDER_SITE_OTHER): Admitting: Urgent Care

## 2015-09-10 VITALS — BP 112/82 | HR 70 | Temp 98.2°F | Resp 16 | Ht 69.0 in | Wt 218.0 lb

## 2015-09-10 DIAGNOSIS — M25562 Pain in left knee: Secondary | ICD-10-CM

## 2015-09-10 DIAGNOSIS — M6248 Contracture of muscle, other site: Secondary | ICD-10-CM | POA: Diagnosis not present

## 2015-09-10 DIAGNOSIS — S46912D Strain of unspecified muscle, fascia and tendon at shoulder and upper arm level, left arm, subsequent encounter: Secondary | ICD-10-CM

## 2015-09-10 DIAGNOSIS — M62838 Other muscle spasm: Secondary | ICD-10-CM

## 2015-09-10 DIAGNOSIS — M25512 Pain in left shoulder: Secondary | ICD-10-CM

## 2015-09-10 DIAGNOSIS — S8002XD Contusion of left knee, subsequent encounter: Secondary | ICD-10-CM

## 2015-09-10 NOTE — Progress Notes (Signed)
    MRN: [id number] DOB: 1975-12-11  Subjective:   Laura HoyerSheila L Schaefer is a 40 y.o. female presenting for Follow-up  Patient suffered a fall while at work on 08/22/2015. Please refer to that note. Today, she presents for f/u since PT has not yet been started. Reports that she continues to feel pain over her left shoulder, trapezius, worse with increased activity and overhead activity. Her left ankle and left knee pain and swelling have improved dramatically. She still has difficulty with kneeling on her left knee. Denies redness, swelling, popping or clicking of her left shoulder. Denies swelling of left knee and ankle.    [name]'s medications list, allergies, past medical history and past surgical history were reviewed and excluded from this note due to being a worker's comp case.  Objective:   Vitals: BP 112/82 mmHg  Pulse 70  Temp(Src) 98.2 F (36.8 C) (Oral)  Resp 16  Ht 5\' 9"  (1.753 m)  Wt 218 lb (98.884 kg)  BMI 32.18 kg/m2  SpO2 100%  LMP 08/28/2015  Physical Exam  Constitutional: She is oriented to person, place, and time. She appears well-developed and well-nourished.  Cardiovascular: Normal rate.   Pulmonary/Chest: Effort normal.  Musculoskeletal:       Left shoulder: She exhibits tenderness (over AC joint, lateral deltoid and trapezius) and spasm (left trapezius). She exhibits normal range of motion, no swelling, no effusion, no crepitus, no deformity, no laceration and normal strength.       Left knee: She exhibits normal range of motion, no swelling, no effusion, no ecchymosis, no deformity, no laceration, no erythema, normal alignment, normal patellar mobility and no bony tenderness. No tenderness found.       Left ankle: She exhibits swelling (trace edema). She exhibits normal range of motion, no ecchymosis, no deformity, no laceration and normal pulse. No tenderness. Achilles tendon normal.  Neurological: She is alert and oriented to person, place, and time. She has normal  reflexes.  Skin: Skin is warm and dry.   Assessment and Plan :   1. Left shoulder strain, subsequent encounter 2. Left shoulder pain 3. Trapezius muscle spasm - Referral to PT requested again. If this has not been scheduled in 1 week, patient is to call and let me know and we will put in a referral to ortho in this case. Continue work restrictions, Flexeril and ibuprofen as needed.  4. Knee contusion, left, subsequent encounter 5. Left knee pain - Improved, continue work restriction for until next visit.  Wallis BambergMario Ayaka Andes, PA-C [facility] [phone] 09/10/2015 10:28 AM

## 2015-09-10 NOTE — Patient Instructions (Addendum)
Shoulder Pain The shoulder is the joint that connects your arms to your body. The bones that form the shoulder joint include the upper arm bone (humerus), the shoulder blade (scapula), and the collarbone (clavicle). The top of the humerus is shaped like a ball and fits into a rather flat socket on the scapula (glenoid cavity). A combination of muscles and strong, fibrous tissues that connect muscles to bones (tendons) support your shoulder joint and hold the ball in the socket. Small, fluid-filled sacs (bursae) are located in different areas of the joint. They act as cushions between the bones and the overlying soft tissues and help reduce friction between the gliding tendons and the bone as you move your arm. Your shoulder joint allows a wide range of motion in your arm. This range of motion allows you to do things like scratch your back or throw a ball. However, this range of motion also makes your shoulder more prone to pain from overuse and injury. Causes of shoulder pain can originate from both injury and overuse and usually can be grouped in the following four categories:  Redness, swelling, and pain (inflammation) of the tendon (tendinitis) or the bursae (bursitis).  Instability, such as a dislocation of the joint.  Inflammation of the joint (arthritis).  Broken bone (fracture). HOME CARE INSTRUCTIONS   Apply ice to the sore area.  Put ice in a plastic bag.  Place a towel between your skin and the bag.  Leave the ice on for 15-20 minutes, 3-4 times per day for the first 2 days, or as directed by your health care provider.  Stop using cold packs if they do not help with the pain.  If you have a shoulder sling or immobilizer, wear it as long as your caregiver instructs. Only remove it to shower or bathe. Move your arm as little as possible, but keep your hand moving to prevent swelling.  Squeeze a soft ball or foam pad as much as possible to help prevent swelling.  Only take  over-the-counter or prescription medicines for pain, discomfort, or fever as directed by your caregiver. SEEK MEDICAL CARE IF:   Your shoulder pain increases, or new pain develops in your arm, hand, or fingers.  Your hand or fingers become cold and numb.  Your pain is not relieved with medicines. SEEK IMMEDIATE MEDICAL CARE IF:   Your arm, hand, or fingers are numb or tingling.  Your arm, hand, or fingers are significantly swollen or turn white or blue. MAKE SURE YOU:   Understand these instructions.  Will watch your condition.  Will get help right away if you are not doing well or get worse.   This information is not intended to replace advice given to you by your health care provider. Make sure you discuss any questions you have with your health care provider.   Document Released: 12/23/2004 Document Revised: 04/05/2014 Document Reviewed: 07/08/2014 Elsevier Interactive Patient Education 2016 Elsevier Inc.     IF you received an x-ray today, you will receive an invoice from [facility]. Please contact [facility] at [phone] with questions or concerns regarding your invoice.   IF you received labwork today, you will receive an invoice from [facility]. Please contact [facility] at [phone] with questions or concerns regarding your invoice.   Our billing staff will not be able to assist you with questions regarding bills from these companies.  You will be contacted with the lab results as soon as they are available. The   fastest way to get your results is to activate your My Chart account. Instructions are located on the last page of this paperwork. If you have not heard from [place] regarding the results in 2 weeks, please contact this office.

## 2015-11-05 ENCOUNTER — Ambulatory Visit (INDEPENDENT_AMBULATORY_CARE_PROVIDER_SITE_OTHER): Admitting: Physician Assistant

## 2015-11-05 VITALS — BP 116/78 | HR 72 | Temp 98.0°F | Resp 18 | Ht 69.0 in | Wt 212.0 lb

## 2015-11-05 DIAGNOSIS — M25512 Pain in left shoulder: Secondary | ICD-10-CM | POA: Diagnosis not present

## 2015-11-05 DIAGNOSIS — S46912D Strain of unspecified muscle, fascia and tendon at shoulder and upper arm level, left arm, subsequent encounter: Secondary | ICD-10-CM

## 2015-11-05 DIAGNOSIS — M62838 Other muscle spasm: Secondary | ICD-10-CM

## 2015-11-05 DIAGNOSIS — S8002XD Contusion of left knee, subsequent encounter: Secondary | ICD-10-CM

## 2015-11-05 NOTE — Progress Notes (Signed)
[facility] [address], [place] [address] [phone]  Date:  11/05/2015   Name:  Nonie HoyerSheila L Righi   DOB:  1975/11/02   MRN:  [id number]  PCP:  No PCP Per Patient    Chief Complaint: Follow-up (w/c )   History of Present Illness:  This is a 40 y.o. female who is presenting for workers comp f/u. She suffered a fall at work on 08/22/15. Pain to left shoulder, trapezius, worse with increased activity and overhead activity. Her last visit on 6/14 she was referred to PT. She was instructed to f/u after completion of PT. She finished her last session 1 week ago. It helped a lot. She reports 95% improvement in her symptoms. She had knee pain initially and was unable to kneel. This is 100% improved. She is ready to be released from work restrictions.  Review of Systems:  Review of Systems See HPI  There are no active problems to display for this patient.   Prior to Admission medications   Medication Sig Start Date End Date Taking? Authorizing Provider  albuterol (PROVENTIL,VENTOLIN) 90 MCG/ACT inhaler Inhale 2 puffs into the lungs every 6 (six) hours as needed.     Yes Historical Provider, MD  hydrochlorothiazide (MICROZIDE) 12.5 MG capsule Take 12.5 mg by mouth daily.   Yes Historical Provider, MD  ibuprofen (ADVIL,MOTRIN) 100 MG/5ML suspension Take 30 mLs (600 mg total) by mouth every 6 (six) hours. 04/29/11  Yes Gerald Leitzara Cole, MD  cyclobenzaprine (FLEXERIL) 5 MG tablet Take 1 tablet (5 mg total) by mouth 3 (three) times daily as needed for muscle spasms. Patient not taking: Reported on 11/05/2015 08/22/15   Wallis BambergMario Mani, PA-C    Allergies  Allergen Reactions  . Penicillins Hives    Patient states she has never taken penicillin but has been told this by her mother who confirms this at bedside    Medication list has been reviewed and updated.  Physical Examination:  Physical Exam  Constitutional: She is oriented to person, place, and time. She appears well-developed  and well-nourished. No distress.  HENT:  Head: Normocephalic and atraumatic.  Right Ear: Hearing normal.  Left Ear: Hearing normal.  Nose: Nose normal.  Eyes: Conjunctivae and lids are normal. Right eye exhibits no discharge. Left eye exhibits no discharge. No scleral icterus.  Pulmonary/Chest: Effort normal. No respiratory distress.  Musculoskeletal: Normal range of motion.       Left shoulder: Normal.       Right knee: Normal.  FROM left shoulder, no pain  Neurological: She is alert and oriented to person, place, and time.  Skin: Skin is warm, dry and intact. No lesion and no rash noted.  Psychiatric: She has a normal mood and affect. Her speech is normal and behavior is normal. Thought content normal.   BP 116/78 (BP Location: Left Arm, Patient Position: Sitting, Cuff Size: Large)   Pulse 72   Temp 98 F (36.7 C) (Oral)   Resp 18   Ht 5\' 9"  (1.753 m)   Wt 212 lb (96.2 kg)   LMP 11/02/2015   SpO2 99%   BMI 31.31 kg/m   Assessment and Plan:  1. Left shoulder strain, subsequent encounter 2. Trapezius muscle spasm 3. Knee contusion, left, subsequent encounter Released for full duty without restrictions. No further f/u needed.   Roswell MinersNicole V. Dyke BrackettBush, PA-C, MHS [facility]  11/05/2015

## 2015-11-05 NOTE — Patient Instructions (Signed)
     IF you received an x-ray today, you will receive an invoice from [facility]. Please contact [facility] at [phone] with questions or concerns regarding your invoice.   IF you received labwork today, you will receive an invoice from [facility]. Please contact [facility] at [phone] with questions or concerns regarding your invoice.   Our billing staff will not be able to assist you with questions regarding bills from these companies.  You will be contacted with the lab results as soon as they are available. The fastest way to get your results is to activate your My Chart account. Instructions are located on the last page of this paperwork. If you have not heard from us regarding the results in 2 weeks, please contact this office.      

## 2016-02-27 ENCOUNTER — Other Ambulatory Visit: Payer: Self-pay | Admitting: Critical Care Medicine

## 2016-02-27 ENCOUNTER — Other Ambulatory Visit: Payer: Self-pay | Admitting: Internal Medicine

## 2016-02-27 ENCOUNTER — Ambulatory Visit
Admission: RE | Admit: 2016-02-27 | Discharge: 2016-02-27 | Disposition: A | Discharge status: Home / Self Care | Payer: Managed Care, Other (non HMO) | Source: Ambulatory Visit | Attending: Internal Medicine | Admitting: Internal Medicine

## 2016-02-27 DIAGNOSIS — R0789 Other chest pain: Secondary | ICD-10-CM

## 2016-07-22 ENCOUNTER — Other Ambulatory Visit: Payer: Self-pay | Admitting: Obstetrics and Gynecology

## 2016-07-22 DIAGNOSIS — Z1231 Encounter for screening mammogram for malignant neoplasm of breast: Secondary | ICD-10-CM

## 2016-08-17 ENCOUNTER — Ambulatory Visit
Admission: RE | Admit: 2016-08-17 | Discharge: 2016-08-17 | Disposition: A | Discharge status: Home / Self Care | Payer: Managed Care, Other (non HMO) | Source: Ambulatory Visit | Attending: Obstetrics and Gynecology | Admitting: Obstetrics and Gynecology

## 2016-08-17 DIAGNOSIS — Z1231 Encounter for screening mammogram for malignant neoplasm of breast: Secondary | ICD-10-CM

## 2016-08-18 ENCOUNTER — Other Ambulatory Visit: Payer: Self-pay | Admitting: Obstetrics and Gynecology

## 2016-08-18 DIAGNOSIS — R928 Other abnormal and inconclusive findings on diagnostic imaging of breast: Secondary | ICD-10-CM

## 2016-08-25 ENCOUNTER — Ambulatory Visit
Admission: RE | Admit: 2016-08-25 | Discharge: 2016-08-25 | Disposition: A | Discharge status: Home / Self Care | Payer: Managed Care, Other (non HMO) | Source: Ambulatory Visit | Attending: Obstetrics and Gynecology | Admitting: Obstetrics and Gynecology

## 2016-08-25 DIAGNOSIS — R928 Other abnormal and inconclusive findings on diagnostic imaging of breast: Secondary | ICD-10-CM

## 2017-07-17 IMAGING — CR DG KNEE COMPLETE 4+V*L*
4 series · 4 of 4 positions shown · non-contrast
Comparison: None.

CLINICAL DATA: Fall.

EXAM:
LEFT KNEE - COMPLETE 4+ VIEW

[AP]
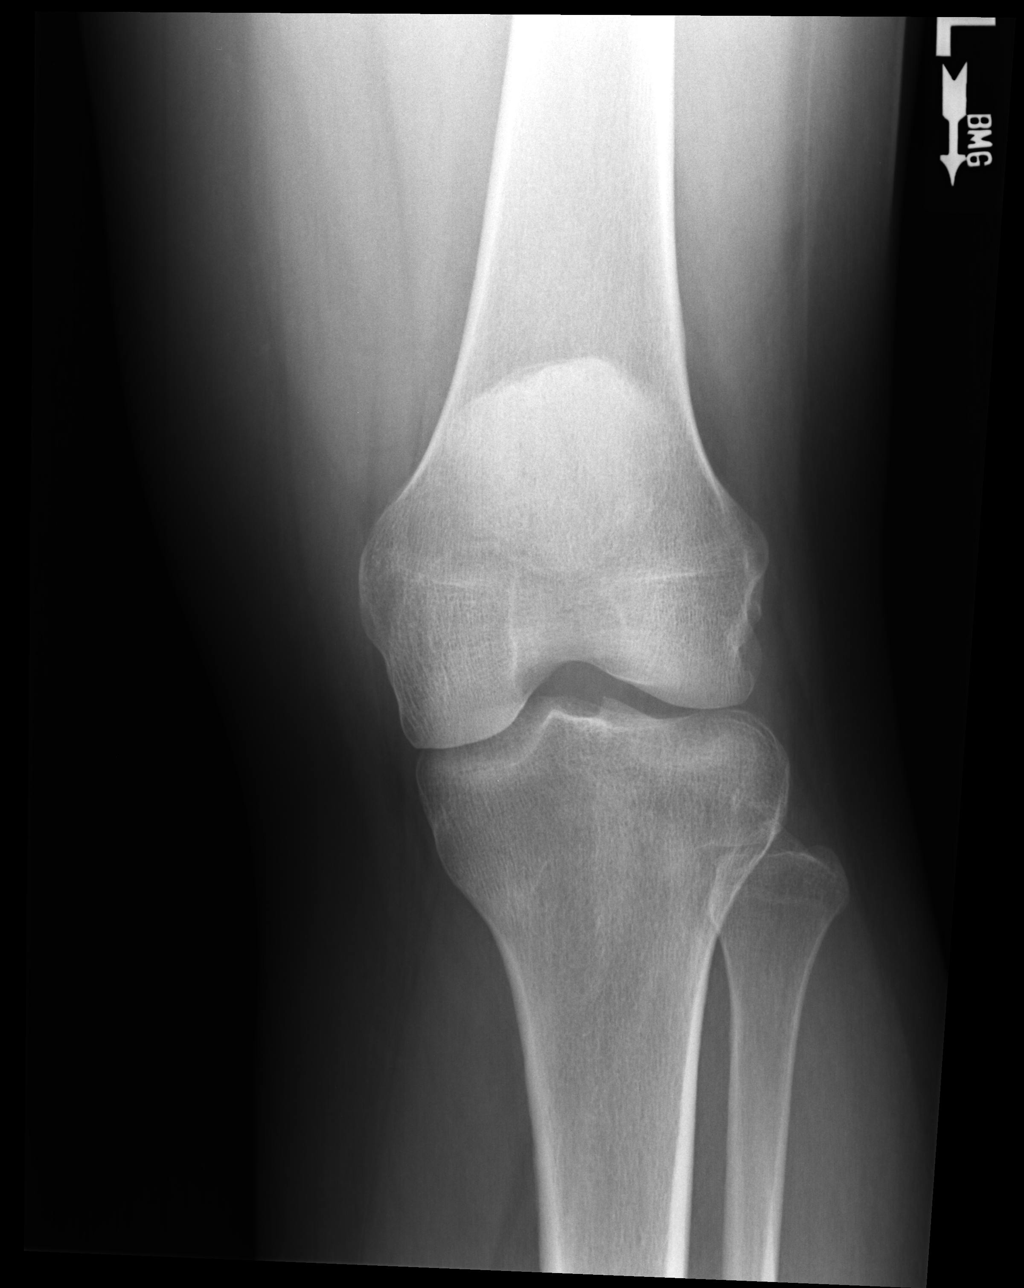

[ap axial]
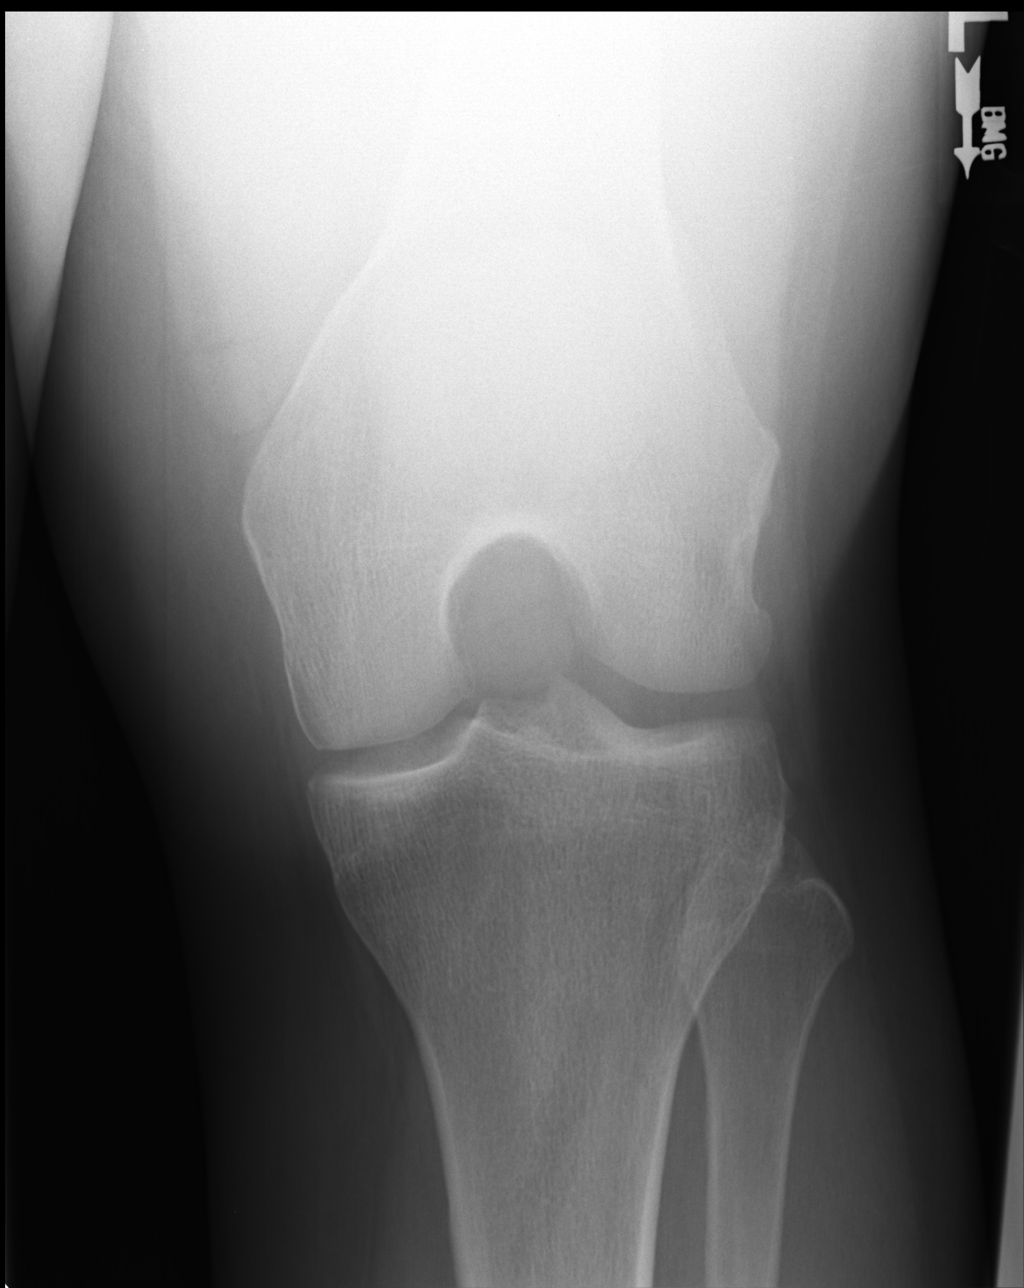

[lateral]
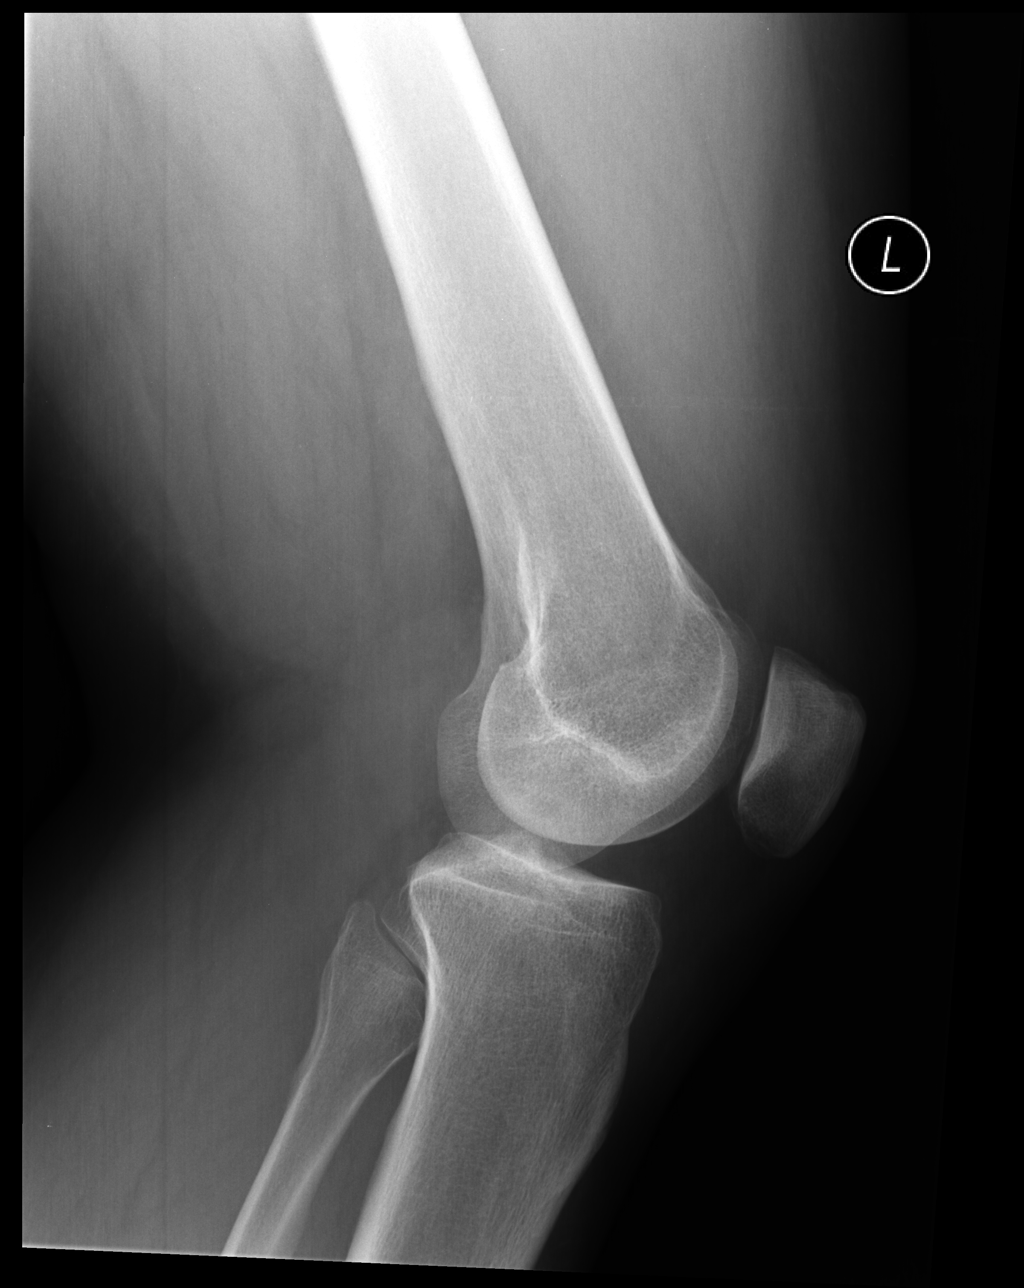

[sunrise]
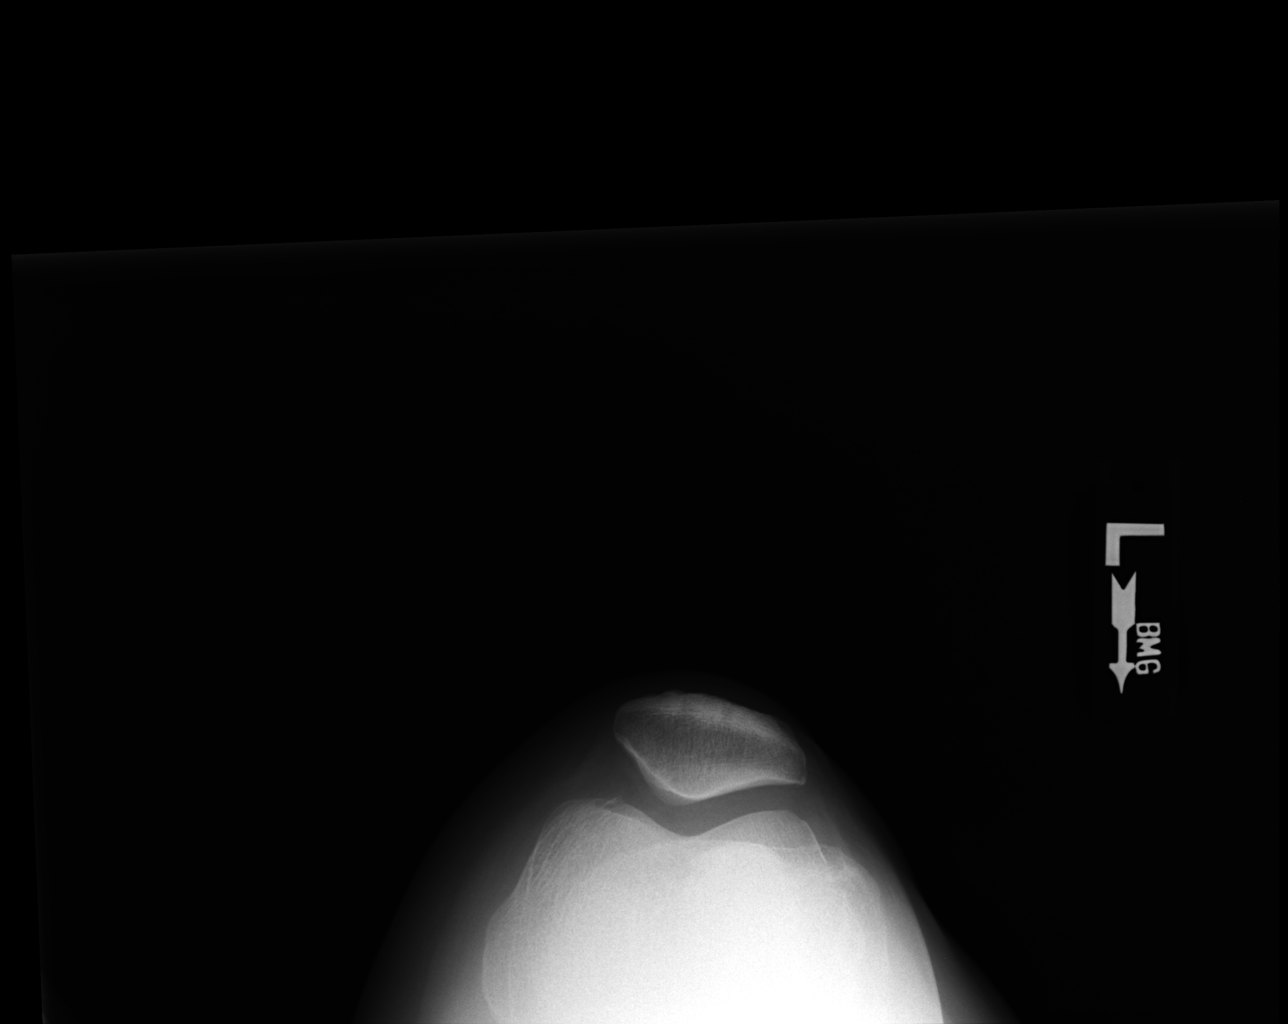

[4 of 4 positions shown; findings below may reference images not displayed]

FINDINGS: Tiny bony densities noted along the anterior femoral condylar region
on sunrise view. Tiny chip fracture cannot be excluded. No other
focal abnormality identified. Tibial plateau is intact.
IMPRESSION: Tiny chip fracture noted along the anterior aspect of the femoral
condylar region on sunrise view cannot be completely excluded. Exam
is otherwise negative.

## 2017-08-12 ENCOUNTER — Other Ambulatory Visit: Payer: Self-pay | Admitting: Obstetrics and Gynecology

## 2017-08-12 DIAGNOSIS — Z1231 Encounter for screening mammogram for malignant neoplasm of breast: Secondary | ICD-10-CM

## 2017-09-06 ENCOUNTER — Ambulatory Visit
Admission: RE | Admit: 2017-09-06 | Discharge: 2017-09-06 | Disposition: A | Discharge status: Home / Self Care | Payer: Managed Care, Other (non HMO) | Source: Ambulatory Visit | Attending: Obstetrics and Gynecology | Admitting: Obstetrics and Gynecology

## 2017-09-06 ENCOUNTER — Ambulatory Visit: Payer: Managed Care, Other (non HMO)

## 2017-09-06 DIAGNOSIS — Z1231 Encounter for screening mammogram for malignant neoplasm of breast: Secondary | ICD-10-CM

## 2017-09-28 ENCOUNTER — Telehealth: Payer: Self-pay | Admitting: Oncology

## 2017-09-28 ENCOUNTER — Encounter: Payer: Self-pay | Admitting: Oncology

## 2017-09-28 NOTE — Telephone Encounter (Signed)
New patient referral received from Dr. Donette LarryHusain for anemia. Pt has been scheduled to see Dr. Clelia CroftShadad on 7/16 at 215pm. Pt aware to arrive 30 minutes early. Letter mailed.

## 2017-10-10 ENCOUNTER — Telehealth: Payer: Self-pay | Admitting: *Deleted

## 2017-10-10 NOTE — Telephone Encounter (Signed)
Called patient to remind her of her NEW patient appointment tomorrow, July, 16th at 2:15 pm. She verbalized understanding.

## 2017-10-11 ENCOUNTER — Inpatient Hospital Stay: Payer: Managed Care, Other (non HMO) | Attending: Oncology | Admitting: Oncology

## 2017-10-11 ENCOUNTER — Inpatient Hospital Stay: Payer: Managed Care, Other (non HMO)

## 2017-10-11 ENCOUNTER — Telehealth: Payer: Self-pay | Admitting: Oncology

## 2017-10-11 VITALS — BP 136/79 | HR 74 | Temp 98.6°F | Resp 18 | Ht 66.5 in | Wt 205.8 lb

## 2017-10-11 DIAGNOSIS — D649 Anemia, unspecified: Secondary | ICD-10-CM

## 2017-10-11 DIAGNOSIS — D509 Iron deficiency anemia, unspecified: Secondary | ICD-10-CM | POA: Diagnosis present

## 2017-10-11 LAB — CBC WITH DIFFERENTIAL (CANCER CENTER ONLY)
Basophils Absolute: 0 10*3/uL (ref 0.0–0.1)
Basophils Relative: 0 %
EOS PCT: 3 %
Eosinophils Absolute: 0.2 10*3/uL (ref 0.0–0.5)
HEMATOCRIT: 33.1 % — AB (ref 34.8–46.6)
HEMOGLOBIN: 10.6 g/dL — AB (ref 11.6–15.9)
LYMPHS ABS: 2.1 10*3/uL (ref 0.9–3.3)
LYMPHS PCT: 27 %
MCH: 23.2 pg — ABNORMAL LOW (ref 25.1–34.0)
MCHC: 32 g/dL (ref 31.5–36.0)
MCV: 72.3 fL — ABNORMAL LOW (ref 79.5–101.0)
MONO ABS: 0.5 10*3/uL (ref 0.1–0.9)
Monocytes Relative: 6 %
NEUTROS ABS: 5 10*3/uL (ref 1.5–6.5)
Neutrophils Relative %: 64 %
Platelet Count: 265 10*3/uL (ref 145–400)
RBC: 4.57 MIL/uL (ref 3.70–5.45)
RDW: 14.9 % — ABNORMAL HIGH (ref 11.2–14.5)
WBC Count: 7.8 10*3/uL (ref 3.9–10.3)

## 2017-10-11 LAB — FERRITIN: FERRITIN: 33 ng/mL (ref 11–307)

## 2017-10-11 LAB — IRON AND TIBC
IRON: 93 ug/dL (ref 41–142)
Saturation Ratios: 32 % (ref 21–57)
TIBC: 294 ug/dL (ref 236–444)
UIBC: 201 ug/dL

## 2017-10-11 NOTE — Telephone Encounter (Signed)
Scheduled appt per 7/16 los - gave patient aVS and calender per los.  

## 2017-10-11 NOTE — Progress Notes (Signed)
Reason for the request:   Anemia  HPI: I was asked by Dr. Eula Listen  to evaluate Laura Schaefer for anemia.  She is a pleasant 42 year old woman with history of asthma and allergy but no significant comorbid conditions.  She has been told that she is anemic for many years dating back to at least 2012.  At that time her hemoglobin was 10 and in 2013 and was 10.4 and drifted down to 8.4.  Her MCV has been slightly low at 74 with elevated RDW.  She had a CBC done by Dr. Eula Listen on October 21, 2017 and showed a hemoglobin of 10.3, MCV of 72 and RDW of 15.1.  Her white cell count was 6.5 and a platelet count of 299.  Based on these findings she was referred to me for evaluation regarding her anemia.  She has been told in the past that she has low iron and was recommend that she takes oral iron supplements but she is intolerant to it.  She has reported issues with phobia of taking pills in general.  She denies any heavy menstrual cycles or menorrhagia.  She denies any hematochezia or melena.  She denies any complications related to her pregnancies.  She denied needing transfusions in the past.  She also does not have any family history of hemoglobinopathies.  She does not report any headaches, blurry vision, syncope or seizures. Does not report any fevers, chills or sweats.  Does not report any cough, wheezing or hemoptysis.  Does not report any chest pain, palpitation, orthopnea or leg edema.  Does not report any nausea, vomiting or abdominal pain.  Does not report any constipation or diarrhea.  Does not report any skeletal complaints.    Does not report frequency, urgency or hematuria.  Does not report any skin rashes or lesions. Does not report any heat or cold intolerance.  Does not report any lymphadenopathy or petechiae.  Does not report any anxiety or depression.  Remaining review of systems is negative.    Past Medical History:  Diagnosis Date  . Asthma   . 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 Herpes   . No  pertinent past medical history   :  Past Surgical History:  Procedure Laterality Date  . NO PAST SURGERIES    . toncillectomy    . TONSILLECTOMY    :   Current Outpatient Medications:  .  albuterol (PROVENTIL,VENTOLIN) 90 MCG/ACT inhaler, Inhale 2 puffs into the lungs every 6 (six) hours as needed.  , Disp: , Rfl:  .  cyclobenzaprine (FLEXERIL) 5 MG tablet, Take 1 tablet (5 mg total) by mouth 3 (three) times daily as needed for muscle spasms. (Patient not taking: Reported on 11/05/2015), Disp: 60 tablet, Rfl: 1 .  hydrochlorothiazide (MICROZIDE) 12.5 MG capsule, Take 12.5 mg by mouth daily., Disp: , Rfl:  .  ibuprofen (ADVIL,MOTRIN) 100 MG/5ML suspension, Take 30 mLs (600 mg total) by mouth every 6 (six) hours., Disp: 240 mL, Rfl: 1:  Allergies  Allergen Reactions  . Penicillins Hives    Patient states she has never taken penicillin but has been told this by her mother who confirms this at bedside  :  Family History  Problem Relation Age of Onset  . Hypertension Mother   . Hypertension Father   . Diabetes Sister   . Hypertension Sister   . Diabetes Paternal Grandmother   . Hypertension Paternal Grandmother   . Cancer Paternal Grandfather   :  Social  History   Socioeconomic History  . Marital status: Single    Spouse name: Not on file  . Number of children: Not on file  . Years of education: Not on file  . Highest education level: Not on file  Occupational History  . Not on file  Social Needs  . Financial resource strain: Not on file  . Food insecurity:    Worry: Not on file    Inability: Not on file  . Transportation needs:    Medical: Not on file    Non-medical: Not on file  Tobacco Use  . Smoking status: Never Smoker  . Smokeless tobacco: Never Used  Substance and Sexual Activity  . Alcohol use: No  . Drug use: No  . Sexual activity: Yes    Comment: tubal  Lifestyle  . Physical activity:    Days per week: Not on file    Minutes per session: Not on file   . Stress: Not on file  Relationships  . Social connections:    Talks on phone: Not on file    Gets together: Not on file    Attends religious service: Not on file    Active member of club or organization: Not on file    Attends meetings of clubs or organizations: Not on file    Relationship status: Not on file  . Intimate partner violence:    Fear of current or ex partner: Not on file    Emotionally abused: Not on file    Physically abused: Not on file    Forced sexual activity: Not on file  Other Topics Concern  . Not on file  Social History Narrative  . Not on file  :  Pertinent items are noted in HPI.  Exam: Blood pressure 136/79, pulse 74, temperature 98.6 F (37 C), temperature source Oral, resp. rate 18, height 5' 6.5" (1.689 m), weight 205 lb 12.8 oz (93.4 kg), SpO2 100 %, unknown if currently breastfeeding.  ECOG 0 General appearance: alert and cooperative appeared without distress. Head: atraumatic without any abnormalities. Eyes: conjunctivae/corneas clear. PERRL.  Sclera anicteric. Throat: lips, mucosa, and tongue normal; without oral thrush or ulcers. Resp: clear to auscultation bilaterally without rhonchi, wheezes or dullness to percussion. Cardio: regular rate and rhythm, S1, S2 normal, no murmur, click, rub or gallop GI: soft, non-tender; bowel sounds normal; no masses,  no organomegaly Skin: Skin color, texture, turgor normal. No rashes or lesions Lymph nodes: Cervical, supraclavicular, and axillary nodes normal. Neurologic: Grossly normal without any motor, sensory or deep tendon reflexes. Musculoskeletal: No joint deformity or effusion.  CBC    Component Value Date/Time   WBC 12.0 (H) 04/27/2011 0535   RBC 3.56 (L) 04/27/2011 0535   HGB 8.4 (L) 04/27/2011 0535   HCT 26.5 (L) 04/27/2011 0535   PLT 196 04/27/2011 0535   MCV 74.4 (L) 04/27/2011 0535   MCH 23.6 (L) 04/27/2011 0535   MCHC 31.7 04/27/2011 0535   RDW 17.5 (H) 04/27/2011 0535        Assessment and Plan:    1. Microcytic anemia dating back to at least 2012.  The differential diagnosis includes iron deficiency versus hemoglobinopathy.  She has not been on any oral iron because of poor tolerance and she has not been checked for hemoglobinopathy although she has no family history of sickle cell or any other abnormalities.  From a management standpoint, I have recommended repeating an iron studies as well as hemoglobin electrophoresis.  Depending on the results  of her iron studies, I discussed treatment options with her today.  These options include oral iron therapy which she is unwilling to take and unable to tolerate.  Intravenous iron was also discussed today including risks and benefits of Feraheme infusion.  His complications include arthralgias, myalgias as well as rarely anaphylaxis.  She is agreeable to have this done if her indeed iron deficiency is documented.  2.  Age-appropriate cancer screening: She is up-to-date without any indication to suggest colorectal cancer or GI blood losses.  Colonoscopy option will be deferred.  3.  Follow-up: We will be in the next few months to follow her progress.  30  minutes was spent with the patient face-to-face today.  More than 50% of time was dedicated to patient counseling, education and coordination of her care.    Thank you for the referral. A copy of this consult has been forwarded to the requesting physician.

## 2017-10-13 ENCOUNTER — Telehealth: Payer: Self-pay | Admitting: *Deleted

## 2017-10-13 LAB — HEMOGLOBINOPATHY EVALUATION
HGB S QUANTITAION: 0 %
Hgb A2 Quant: 2.1 % (ref 1.8–3.2)
Hgb A: 97.9 % (ref 96.4–98.8)
Hgb C: 0 %
Hgb F Quant: 0 % (ref 0.0–2.0)
Hgb Variant: 0 %

## 2017-10-13 NOTE — Telephone Encounter (Signed)
-----   Message from Benjiman CoreFiras N Shadad, MD sent at 10/13/2017  3:46 PM EDT ----- Please let her know her labs are normal. No iron is needed. No sickle cell detected.

## 2017-10-13 NOTE — Telephone Encounter (Signed)
Spoke with patient, gave lab results from today.

## 2018-03-14 ENCOUNTER — Inpatient Hospital Stay: Payer: Managed Care, Other (non HMO) | Attending: Oncology | Admitting: Oncology

## 2018-03-14 ENCOUNTER — Inpatient Hospital Stay: Payer: Managed Care, Other (non HMO)

## 2018-09-28 ENCOUNTER — Other Ambulatory Visit: Payer: Self-pay | Admitting: Obstetrics and Gynecology

## 2018-09-28 DIAGNOSIS — Z1231 Encounter for screening mammogram for malignant neoplasm of breast: Secondary | ICD-10-CM

## 2018-10-05 ENCOUNTER — Other Ambulatory Visit: Payer: Self-pay | Admitting: Obstetrics and Gynecology

## 2018-10-05 ENCOUNTER — Other Ambulatory Visit (HOSPITAL_COMMUNITY)
Admission: RE | Admit: 2018-10-05 | Discharge: 2018-10-05 | Disposition: A | Discharge status: Home / Self Care | Payer: Managed Care, Other (non HMO) | Source: Ambulatory Visit | Attending: Obstetrics and Gynecology | Admitting: Obstetrics and Gynecology

## 2018-10-05 DIAGNOSIS — Z01419 Encounter for gynecological examination (general) (routine) without abnormal findings: Secondary | ICD-10-CM | POA: Diagnosis present

## 2018-10-06 LAB — CYTOLOGY - PAP
Diagnosis: NEGATIVE
HPV: NOT DETECTED

## 2018-10-31 ENCOUNTER — Other Ambulatory Visit: Payer: Self-pay

## 2018-10-31 ENCOUNTER — Ambulatory Visit
Admission: RE | Admit: 2018-10-31 | Discharge: 2018-10-31 | Disposition: A | Discharge status: Home / Self Care | Payer: Managed Care, Other (non HMO) | Source: Ambulatory Visit | Attending: Obstetrics and Gynecology | Admitting: Obstetrics and Gynecology

## 2018-10-31 DIAGNOSIS — Z1231 Encounter for screening mammogram for malignant neoplasm of breast: Secondary | ICD-10-CM

## 2018-11-01 ENCOUNTER — Other Ambulatory Visit: Payer: Self-pay | Admitting: Obstetrics and Gynecology

## 2018-11-01 DIAGNOSIS — N644 Mastodynia: Secondary | ICD-10-CM

## 2018-11-09 ENCOUNTER — Ambulatory Visit
Admission: RE | Admit: 2018-11-09 | Discharge: 2018-11-09 | Disposition: A | Discharge status: Home / Self Care | Payer: Managed Care, Other (non HMO) | Source: Ambulatory Visit | Attending: Obstetrics and Gynecology | Admitting: Obstetrics and Gynecology

## 2018-11-09 ENCOUNTER — Other Ambulatory Visit: Payer: Self-pay

## 2018-11-09 DIAGNOSIS — N644 Mastodynia: Secondary | ICD-10-CM

## 2018-12-11 ENCOUNTER — Other Ambulatory Visit: Payer: Self-pay

## 2018-12-11 DIAGNOSIS — Z20822 Contact with and (suspected) exposure to covid-19: Secondary | ICD-10-CM

## 2018-12-12 LAB — NOVEL CORONAVIRUS, NAA: SARS-CoV-2, NAA: DETECTED — AB

## 2018-12-13 ENCOUNTER — Telehealth: Payer: Self-pay | Admitting: General Practice

## 2018-12-13 NOTE — Telephone Encounter (Signed)
Per patient request COVID test result faxed to employer; [employer], Attention: Aaron Edelman, at [phone].

## 2018-12-13 NOTE — Telephone Encounter (Signed)
Patient inquiring if result note can be faxed to her employer at number below. Patient unable to print from [place] and states she cannot reach her PCP to request.   Attn: Levonne Lapping  Fax# [phone]

## 2018-12-25 ENCOUNTER — Other Ambulatory Visit: Payer: Self-pay

## 2018-12-25 DIAGNOSIS — U071 COVID-19: Secondary | ICD-10-CM

## 2018-12-26 LAB — NOVEL CORONAVIRUS, NAA: SARS-CoV-2, NAA: DETECTED — AB

## 2018-12-26 LAB — SPECIMEN STATUS REPORT

## 2018-12-28 ENCOUNTER — Other Ambulatory Visit: Payer: Self-pay

## 2018-12-28 DIAGNOSIS — Z20822 Contact with and (suspected) exposure to covid-19: Secondary | ICD-10-CM

## 2018-12-29 LAB — NOVEL CORONAVIRUS, NAA: SARS-CoV-2, NAA: NOT DETECTED

## 2019-01-02 ENCOUNTER — Other Ambulatory Visit: Payer: Self-pay

## 2019-01-02 DIAGNOSIS — Z20822 Contact with and (suspected) exposure to covid-19: Secondary | ICD-10-CM

## 2019-01-04 LAB — NOVEL CORONAVIRUS, NAA: SARS-CoV-2, NAA: NOT DETECTED

## 2019-05-10 ENCOUNTER — Other Ambulatory Visit: Payer: Managed Care, Other (non HMO)

## 2019-09-03 ENCOUNTER — Ambulatory Visit: Payer: Self-pay | Attending: Internal Medicine

## 2019-11-26 ENCOUNTER — Ambulatory Visit (INDEPENDENT_AMBULATORY_CARE_PROVIDER_SITE_OTHER): Payer: Managed Care, Other (non HMO) | Admitting: Allergy

## 2019-11-26 ENCOUNTER — Ambulatory Visit: Payer: Self-pay | Admitting: Allergy

## 2019-11-26 ENCOUNTER — Encounter: Payer: Self-pay | Admitting: Allergy

## 2019-11-26 ENCOUNTER — Other Ambulatory Visit: Payer: Self-pay

## 2019-11-26 VITALS — BP 110/78 | HR 78 | Temp 97.6°F | Resp 18 | Ht 66.0 in | Wt 205.0 lb

## 2019-11-26 DIAGNOSIS — J3089 Other allergic rhinitis: Secondary | ICD-10-CM | POA: Diagnosis not present

## 2019-11-26 DIAGNOSIS — T63441A Toxic effect of venom of bees, accidental (unintentional), initial encounter: Secondary | ICD-10-CM | POA: Insufficient documentation

## 2019-11-26 DIAGNOSIS — T63441D Toxic effect of venom of bees, accidental (unintentional), subsequent encounter: Secondary | ICD-10-CM

## 2019-11-26 DIAGNOSIS — J4540 Moderate persistent asthma, uncomplicated: Secondary | ICD-10-CM | POA: Diagnosis not present

## 2019-11-26 MED ORDER — EPINEPHRINE 0.3 MG/0.3ML IJ SOAJ: 0.3000 mg | Freq: Once | INTRAMUSCULAR | 2 refills | Status: AC

## 2019-11-26 MED ORDER — ADVAIR HFA 115-21 MCG/ACT IN AERO: 2.0000 | INHALATION_SPRAY | Freq: Two times a day (BID) | RESPIRATORY_TRACT | 5 refills | Status: DC

## 2019-11-26 NOTE — Progress Notes (Signed)
New Patient Note  RE: Laura Schaefer MRN: [id number] DOB: [birth year] Date of Office Visit: 11/26/2019  Referring provider: Georgann Housekeeper, MD Primary care provider: Georgann Housekeeper, MD  Chief Complaint: Insect Bite (occurred in May. Some type of bee got inside of her mask and stung her three times. She is unsure of the type as she was very busy getting it off of her face. She developed left side face swellng, numbness, tongue swelling. No other symptoms. )  History of Present Illness: I had the pleasure of seeing Laura Schaefer for initial evaluation at the [facility] on 11/26/2019. She is a 44 y.o. female, who is referred here by Georgann Housekeeper, MD for the evaluation of insect bite reaction.  Patient was stung in May/June. Something got into her mask and stung her 3 times on the left side. She felt numbness on the left side immediately and also had slight tongue swelling. No issues with breathing. Denies any rash or pruritus anywhere else.   This was the first time she got stung by a bee.  [facility] which helped. It took about a few hours for symptoms to resolve.   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   Fire ant in the past caused some localized reactions.  Assessment and Plan: Bushra is a 44 y.o. female with: Bee sting reaction Hymenoptera sting 3 times on the left side of her face which caused some numbness and slight tongue swelling on the same side.  Denies any other associated symptoms.  [facility] and was treated with Benadryl.  Symptoms resolved after couple hours.  This was the first time she got stung by a bee.  Patient works for the post [employer] and comes across various bees, insect at all times. Fire ants caused large localized swelling in the past.   Skin testing positive to fire ant.  Need to get bloodwork for hymenoptera panel and tryptase level.  If it  is positive then will recommend venom immunotherapy given her job.  I have prescribed epinephrine injectable and demonstrated proper use. For mild symptoms you can take over the counter antihistamines such as Benadryl and monitor symptoms closely. If symptoms worsen or if you have severe symptoms including breathing issues, throat closure, significant swelling, whole body hives, severe diarrhea and vomiting, lightheadedness then inject epinephrine and seek immediate medical care afterwards.  Emergency action plan given.  Moderate persistent asthma without complication Diagnosed with asthma over 20+ years ago.  Due to insurance she was switched from Symbicort to Advair Diskus the past month.  Asthma has not been as well controlled with the Advair Diskus.  Today's spirometry was normal. . Daily controller medication(s): start Advair 2 puffs twice a day and rinse mouth after each use. This replaces the Diskus. If not covered or too expensive let us know. . May use albuterol rescue inhaler 2 puffs every 4 to 6 hours as needed for shortness of breath, chest tightness, coughing, and wheezing. May use albuterol rescue inhaler 2 puffs 5 to 15 minutes prior to strenuous physical activities. Monitor frequency of use.   Other allergic rhinitis Mild rhinitis symptoms during the spring and takes over-the-counter antihistamines with good benefit.  Skin testing in the past was positive to dust mites, cockroaches and grass per patient report.  No previous allergy immunotherapy.  Today's skin testing showed: Positive to grass, dust  mites, cat, cockroach and fire ant.   Start environmental control measures as below.  May use over the counter antihistamines such as Zyrtec (cetirizine), Claritin (loratadine), Allegra (fexofenadine), or Xyzal (levocetirizine) daily as needed.  Return in about 3 months (around 02/26/2020).  Meds ordered this encounter  Medications  . fluticasone-salmeterol (ADVAIR HFA)  115-21 MCG/ACT inhaler    Sig: Inhale 2 puffs into the lungs 2 (two) times daily. rinse mouth afterwards.    Dispense:  1 each    Refill:  5  . EPINEPHrine (AUVI-Q) 0.3 mg/0.3 mL IJ SOAJ injection    Sig: Inject 0.3 mLs (0.3 mg total) into the muscle once for 1 dose. As directed for life-threatening allergic reactions    Dispense:  2 each    Refill:  2    Contact info: [phone]    Lab Orders     Tryptase     Allergen Hymenoptera Panel     Allergen Fire Ant  Other allergy screening: Asthma: yes  She reports symptoms of  chest tightness, shortness of breath, coughing, wheezing for 20+ years. Current medications include Advair 250mcg 1 puff BID x 1 month and albuterol prn which help. She tried the following inhalers: Symbicort x 2 yrs. Main triggers are laughing, weather changes, smoke, perfumes. In the last month, frequency of symptoms: daily sometimes. Frequency of nocturnal symptoms: 0x/month. Frequency of SABA use: <0x/week. Interference with physical activity: no. Sleep is undisturbed. In the last 12 months, emergency room visits/urgent care visits/doctor office visits or hospitalizations due to respiratory issues: 0. In the last 12 months, oral steroids courses: 0. Lifetime history of hospitalization for respiratory issues: no. Prior intubations: no. Asthma was diagnosed at age 44. History of pneumonia: no. She was evaluated by allergist in the past. Smoking exposure: no. Up to date with flu vaccine: yes. Up to date with pneumonia vaccine: no. Up to date with COVID-19 vaccine: yes.  History of reflux: yes but not taking anything on a daily basis. Take Nexium as needed and now better with diet changes.   Rhino conjunctivitis: yes  Had skin testing was positive to dust mites, roaches, grass per patient report.  Not taking any daily medications. Takes OTC antihistamines during the spring months with good benefit.  Food allergy: no Medication allergy: yes  Penicillin caused  palpitations and rash - over 10 years ago.  Urticaria: no Eczema:no History of recurrent infections suggestive of immunodeficency: no  Diagnostics: Spirometry:  Tracings reviewed. Her effort: Good reproducible efforts. FVC: 3.13L FEV1: 2.54L, 97% predicted FEV1/FVC ratio: 81% Interpretation: Spirometry consistent with normal pattern.  Please see scanned spirometry results for details.  Skin Testing: Environmental allergy panel. Positive to grass, dust mites, cat, cockroach and fire ant.  Results discussed with patient/family.  Airborne Adult Perc - 11/26/19 1000    Time Antigen Placed [id number]    Allergen Manufacturer Waynette ButteryGreer    Location Back    Number of Test 60    1. Control-Buffer 50% Glycerol Negative    2. Control-Histamine 1 mg/ml 2+    3. Albumin saline Negative    4. Bahia 4+    5. [place] 3+    6. Johnson 4+    7. Kentucky Blue 3+    8. Meadow Fescue 2+    9. Perennial Rye 2+    10. Sweet Vernal 2+    11. Timothy 3+    12. Cocklebur Negative    13. Burweed Marshelder Negative    14. Ragweed, short Negative  15. Ragweed, Giant Negative    16. Plantain,  English Negative    17. Lamb's Quarters Negative    18. Sheep Sorrell Negative    19. Rough Pigweed Negative    20. Marsh Elder, Rough Negative    21. Mugwort, Common Negative    22. Ash mix Negative    23. Birch mix Negative    24. Beech American Negative    25. Box, Elder Negative    26. Cedar, red Negative    27. Cottonwood, Guinea-Bissau Negative    28. Elm mix Negative    29. Hickory Negative    30. Maple mix Negative    31. Oak, Guinea-[place]issau mix Negative    32. Pecan Pollen Negative    33. Pine mix Negative    34. Sycamore Eastern Negative    35. Walnut, Black Pollen Negative    36. Alternaria alternata Negative    37. Cladosporium Herbarum Negative    38. Aspergillus mix Negative    39. Penicillium mix Negative    40. Bipolaris sorokiniana (Helminthosporium) Negative    41. Drechslera spicifera  (Curvularia) Negative    42. Mucor plumbeus Negative    43. Fusarium moniliforme Negative    44. Aureobasidium pullulans (pullulara) Negative    45. Rhizopus oryzae Negative    46. Botrytis cinera Negative    47. Epicoccum nigrum Negative    48. Phoma betae Negative    49. Candida Albicans Negative    50. Trichophyton mentagrophytes Negative    51. Mite, D Farinae  5,000 AU/ml 4+    52. Mite, D Pteronyssinus  5,000 AU/ml 4+    53. Cat Hair 10,000 BAU/ml 3+    54.  Dog Epithelia Negative    55. Mixed Feathers Negative    56. Horse Epithelia Negative    57. Cockroach, German 3+    58. Mouse Negative    59. Tobacco Leaf Negative    Other 2+   Fire Ant   Other Omitted           Past Medical History: Patient Active Problem List   Diagnosis Date Noted  . Bee sting reaction 11/26/2019  . Moderate persistent asthma without complication 11/26/2019  . Other allergic rhinitis 11/26/2019   Past Medical History:  Diagnosis Date  . Angio-edema   . Asthma   . 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 Herpes   . No pertinent past medical history    Past Surgical History: Past Surgical History:  Procedure Laterality Date  . toncillectomy    . TONSILLECTOMY     Medication List:  Current Outpatient Medications  Medication Sig Dispense Refill  . albuterol (PROVENTIL,VENTOLIN) 90 MCG/ACT inhaler Inhale 2 puffs into the lungs every 6 (six) hours as needed.      . hydrochlorothiazide (MICROZIDE) 12.5 MG capsule Take 12.5 mg by mouth daily.    . valACYclovir (VALTREX) 500 MG tablet     . EPINEPHrine (AUVI-Q) 0.3 mg/0.3 mL IJ SOAJ injection Inject 0.3 mLs (0.3 mg total) into the muscle once for 1 dose. As directed for life-threatening allergic reactions 2 each 2  . fluticasone-salmeterol (ADVAIR HFA) 115-21 MCG/ACT inhaler Inhale 2 puffs into the lungs 2 (two) times daily. rinse mouth afterwards. 1 each 5   No current facility-administered medications for this visit.    Allergies: Allergies  Allergen Reactions  . Penicillins Hives and Palpitations   Social History: Social History   Socioeconomic History  . Marital status: Single  Spouse name: Not on file  . Number of children: Not on file  . Years of education: Not on file  . Highest education level: Not on file  Occupational History  . Not on file  Tobacco Use  . Smoking status: Never Smoker  . Smokeless tobacco: Never Used  Vaping Use  . Vaping Use: Never used  Substance and Sexual Activity  . Alcohol use: No  . Drug use: No  . Sexual activity: Yes    Comment: tubal  Other Topics Concern  . Not on file  Social History Narrative  . Not on file   Social Determinants of Health   Financial Resource Strain:   . Difficulty of Paying Living Expenses: Not on file  Food Insecurity:   . Worried About Programme researcher, broadcasting/film/video in the Last Year: Not on file  . Ran Out of Food in the Last Year: Not on file  Transportation Needs:   . Lack of Transportation (Medical): Not on file  . Lack of Transportation (Non-Medical): Not on file  Physical Activity:   . Days of Exercise per Week: Not on file  . Minutes of Exercise per Session: Not on file  Stress:   . Feeling of Stress : Not on file  Social Connections:   . Frequency of Communication with Friends and Family: Not on file  . Frequency of Social Gatherings with Friends and Family: Not on file  . Attends Religious Services: Not on file  . Active Member of Clubs or Organizations: Not on file  . Attends Banker Meetings: Not on file  . Marital Status: Not on file   Lives in a 45 year old home. Smoking: denies Occupation: post Tax inspector HistorySurveyor, minerals in the house: no Carpet in the family room: yes Carpet in the bedroom: yes Heating: electric Cooling: central Pet: yes 1 dog x 8 months  Family History: Family History  Problem Relation Age of Onset  . Hypertension Mother   . Asthma Mother   .  Hypertension Father   . Diabetes Sister   . Hypertension Sister   . Diabetes Paternal Grandmother   . Hypertension Paternal Grandmother   . Cancer Paternal Grandfather    Review of Systems  Constitutional: Negative for appetite change, chills, fever and unexpected weight change.  HENT: Negative for congestion and rhinorrhea.   Eyes: Negative for itching.  Respiratory: Positive for shortness of breath. Negative for cough, chest tightness and wheezing.   Cardiovascular: Negative for chest pain.  Gastrointestinal: Negative for abdominal pain.  Genitourinary: Negative for difficulty urinating.  Skin: Negative for rash.  Allergic/Immunologic: Positive for environmental allergies.  Neurological: Negative for headaches.   Objective: BP 110/78 (BP Location: Left Arm, Patient Position: Sitting, Cuff Size: Large)   Pulse 78   Temp 97.6 F (36.4 C) (Temporal)   Resp 18   Ht 5\' 6"  (1.676 m)   Wt 205 lb (93 kg)   SpO2 98%   BMI 33.09 kg/m  Body mass index is 33.09 kg/m. Physical Exam Vitals and nursing note reviewed.  Constitutional:      Appearance: Normal appearance. She is well-developed.  HENT:     Head: Normocephalic and atraumatic.     Right Ear: External ear normal.     Left Ear: External ear normal.     Nose: Nose normal.     Mouth/Throat:     Mouth: Mucous membranes are moist.     Pharynx: Oropharynx is clear.  Eyes:  Conjunctiva/sclera: Conjunctivae normal.  Cardiovascular:     Rate and Rhythm: Normal rate and regular rhythm.     Heart sounds: Normal heart sounds. No murmur heard.  No friction rub. No gallop.   Pulmonary:     Effort: Pulmonary effort is normal.     Breath sounds: Normal breath sounds. No wheezing, rhonchi or rales.  Abdominal:     Palpations: Abdomen is soft.  Musculoskeletal:     Cervical back: Neck supple.  Skin:    General: Skin is warm.     Findings: No rash.  Neurological:     Mental Status: She is alert and oriented to person, place,  and time.  Psychiatric:        Behavior: Behavior normal.    The plan was reviewed with the patient/family, and all questions/concerned were addressed.  It was my pleasure to see Laura Schaefer today and participate in her care. Please feel free to contact me with any questions or concerns.  Sincerely,  Wyline Mood, DO Allergy & Immunology  [facility] office: [phone] [facility] office: [phone] [place] office: [phone]

## 2019-11-26 NOTE — Assessment & Plan Note (Addendum)
Mild rhinitis symptoms during the spring and takes over-the-counter antihistamines with good benefit.  Skin testing in the past was positive to dust mites, cockroaches and grass per patient report.  No previous allergy immunotherapy.  Today's skin testing showed: Positive to grass, dust mites, cat, cockroach and fire ant.   Start environmental control measures as below.  May use over the counter antihistamines such as Zyrtec (cetirizine), Claritin (loratadine), Allegra (fexofenadine), or Xyzal (levocetirizine) daily as needed.

## 2019-11-26 NOTE — Patient Instructions (Addendum)
Bee sting: Get bloodwork:  We are ordering labs, so please allow 1-2 weeks for the results to come back. With the newly implemented Cures Act, the labs might be visible to you at the same time that they become visible to me. However, I will not address the results until all of the results are back, so please be patient.  In the meantime, continue recommendations in your patient instructions, including avoidance measures (if applicable), until you hear from me.  I have prescribed epinephrine injectable and demonstrated proper use. For mild symptoms you can take over the counter antihistamines such as Benadryl and monitor symptoms closely. If symptoms worsen or if you have severe symptoms including breathing issues, throat closure, significant swelling, whole body hives, severe diarrhea and vomiting, lightheadedness then inject epinephrine and seek immediate medical care afterwards.  Emergency action plan given.  Asthma: . Your breathing test was normal today.  . Daily controller medication(s): start Advair 2 puffs twice a day and rinse mouth after each use. This replaces the Diskus. If not covered or too expensive let us know. . May use albuterol rescue inhaler 2 puffs every 4 to 6 hours as needed for shortness of breath, chest tightness, coughing, and wheezing. May use albuterol rescue inhaler 2 puffs 5 to 15 minutes prior to strenuous physical activities. Monitor frequency of use.  . Asthma control goals:  o Full participation in all desired activities (may need albuterol before activity) o Albuterol use two times or less a week on average (not counting use with activity) o Cough interfering with sleep two times or less a month o Oral steroids no more than once a year o No hospitalizations  Today's skin testing showed:  Positive to grass, dust mites, cat, cockroach and fire ant.   Environmental allergies  Start environmental control measures as below.  May use over the counter  antihistamines such as Zyrtec (cetirizine), Claritin (loratadine), Allegra (fexofenadine), or Xyzal (levocetirizine) daily as needed.  Follow up in 3 months or sooner if needed.   Reducing Pollen Exposure . Pollen seasons: trees (spring), grass (summer) and ragweed/weeds (fall). Marland Kitchen Keep windows closed in your home and car to lower pollen exposure.  Lilian Kapur air conditioning in the bedroom and throughout the house if possible.  . Avoid going out in dry windy days - especially early morning. . Pollen counts are highest between 5 - 10 AM and on dry, hot and windy days.  . Save outside activities for late afternoon or after a heavy rain, when pollen levels are lower.  . Avoid mowing of grass if you have grass pollen allergy. Marland Kitchen Be aware that pollen can also be transported indoors on people and pets.  . Dry your clothes in an automatic dryer rather than hanging them outside where they might collect pollen.  . Rinse hair and eyes before bedtime. ; Control of House Dust Mite Allergen . Dust mite allergens are a common trigger of allergy and asthma symptoms. While they can be found throughout the house, these microscopic creatures thrive in warm, humid environments such as bedding, upholstered furniture and carpeting. . Because so much time is spent in the bedroom, it is essential to reduce mite levels there.  . Encase pillows, mattresses, and box springs in special allergen-proof fabric covers or airtight, zippered plastic covers.  . Bedding should be washed weekly in hot water (130 F) and dried in a hot dryer. Allergen-proof covers are available for comforters and pillows that can't be regularly washed.  Marland Kitchen  Wash the allergy-proof covers every few months. Minimize clutter in the bedroom. Keep pets out of the bedroom.  Marland Kitchen Keep humidity less than 50% by using a dehumidifier or air conditioning. You can buy a humidity measuring device called a hygrometer to monitor this.  . If possible, replace carpets  with hardwood, linoleum, or washable area rugs. If that's not possible, vacuum frequently with a vacuum that has a HEPA filter. . Remove all upholstered furniture and non-washable window drapes from the bedroom. . Remove all non-washable stuffed toys from the bedroom.  Wash stuffed toys weekly. Cockroach Allergen Avoidance Cockroaches are often found in the homes of densely populated urban areas, schools or commercial buildings, but these creatures can lurk almost anywhere. This does not mean that you have a dirty house or living area. . Block all areas where roaches can enter the home. This includes crevices, wall cracks and windows.  . Cockroaches need water to survive, so fix and seal all leaky faucets and pipes. Have an exterminator go through the house when your family and pets are gone to eliminate any remaining roaches. Marland Kitchen Keep food in lidded containers and put pet food dishes away after your pets are done eating. Vacuum and sweep the floor after meals, and take out garbage and recyclables. Use lidded garbage containers in the kitchen. Wash dishes immediately after use and clean under stoves, refrigerators or toasters where crumbs can accumulate. Wipe off the stove and other kitchen surfaces and cupboards regularly. Pet Allergen Avoidance: . Contrary to popular opinion, there are no "hypoallergenic" breeds of dogs or cats. That is because people are not allergic to an animal's hair, but to an allergen found in the animal's saliva, dander (dead skin flakes) or urine. Pet allergy symptoms typically occur within minutes. For some people, symptoms can build up and become most severe 8 to 12 hours after contact with the animal. People with severe allergies can experience reactions in public places if dander has been transported on the pet owners' clothing. Marland Kitchen Keeping an animal outdoors is only a partial solution, since homes with pets in the yard still have higher concentrations of animal  allergens. . Before getting a pet, ask your allergist to determine if you are allergic to animals. If your pet is already considered part of your family, try to minimize contact and keep the pet out of the bedroom and other rooms where you spend a great deal of time. . As with dust mites, vacuum carpets often or replace carpet with a hardwood floor, tile or linoleum. . High-efficiency particulate air (HEPA) cleaners can reduce allergen levels over time. . While dander and saliva are the source of cat and dog allergens, urine is the source of allergens from rabbits, hamsters, mice and [place] pigs; so ask a non-allergic family member to clean the animal's cage. . If you have a pet allergy, talk to your allergist about the potential for allergy immunotherapy (allergy shots). This strategy can often provide long-term relief.

## 2019-11-26 NOTE — Assessment & Plan Note (Signed)
Hymenoptera sting 3 times on the left side of her face which caused some numbness and slight tongue swelling on the same side.  Denies any other associated symptoms.  [facility] and [facility] with Benadryl.  Symptoms resolved after couple hours.  This was the first time she got stung by a bee.  Patient works for the [employer] and comes across various bees, insect at all times. Fire ants caused large localized swelling in the past.   Skin testing positive to fire ant.  Need to get bloodwork for hymenoptera panel and tryptase level.  If it is positive then will recommend venom immunotherapy given her job.  I have prescribed epinephrine injectable and demonstrated proper use. For mild symptoms you can take over the counter antihistamines such as Benadryl and monitor symptoms closely. If symptoms worsen or if you have severe symptoms including breathing issues, throat closure, significant swelling, whole body hives, severe diarrhea and vomiting, lightheadedness then inject epinephrine and seek immediate medical care afterwards.  Emergency action plan given.

## 2019-11-26 NOTE — Assessment & Plan Note (Signed)
Diagnosed with asthma over 20+ years ago.  Due to insurance she was switched from Symbicort to Advair Diskus the past month.  Asthma has not been as well controlled with the Advair Diskus.  Today's spirometry was normal. . Daily controller medication(s): start Advair 2 puffs twice a day and rinse mouth after each use. This replaces the Diskus. If not covered or too expensive let us know. . May use albuterol rescue inhaler 2 puffs every 4 to 6 hours as needed for shortness of breath, chest tightness, coughing, and wheezing. May use albuterol rescue inhaler 2 puffs 5 to 15 minutes prior to strenuous physical activities. Monitor frequency of use.

## 2019-11-28 LAB — TRYPTASE: Tryptase: 4.5 ug/L (ref 2.2–13.2)

## 2019-11-28 LAB — ALLERGEN HYMENOPTERA PANEL
Bumblebee: <0.1 kU/L
Honeybee IgE: <0.1 kU/L
Hornet, White Face, IgE: 1.27 kU/L — AB
Hornet, Yellow, IgE: 1.26 kU/L — AB
Paper Wasp IgE: 0.96 kU/L — AB
Yellow Jacket, IgE: 1.28 kU/L — AB

## 2019-11-28 LAB — ALLERGEN FIRE ANT: I070-IgE Fire Ant (Invicta): 4.37 kU/L — AB

## 2019-12-11 ENCOUNTER — Other Ambulatory Visit: Payer: Self-pay

## 2019-12-11 DIAGNOSIS — Z20822 Contact with and (suspected) exposure to covid-19: Secondary | ICD-10-CM

## 2019-12-13 LAB — NOVEL CORONAVIRUS, NAA: SARS-CoV-2, NAA: NOT DETECTED

## 2019-12-13 LAB — SARS-COV-2, NAA 2 DAY TAT

## 2020-01-10 ENCOUNTER — Other Ambulatory Visit: Payer: Self-pay | Admitting: Obstetrics and Gynecology

## 2020-01-10 DIAGNOSIS — Z1231 Encounter for screening mammogram for malignant neoplasm of breast: Secondary | ICD-10-CM

## 2020-02-05 ENCOUNTER — Other Ambulatory Visit: Payer: Self-pay

## 2020-02-05 ENCOUNTER — Ambulatory Visit
Admission: RE | Admit: 2020-02-05 | Discharge: 2020-02-05 | Disposition: A | Discharge status: Home / Self Care | Payer: Managed Care, Other (non HMO) | Source: Ambulatory Visit | Attending: Obstetrics and Gynecology | Admitting: Obstetrics and Gynecology

## 2020-02-05 DIAGNOSIS — Z1231 Encounter for screening mammogram for malignant neoplasm of breast: Secondary | ICD-10-CM

## 2020-02-18 ENCOUNTER — Other Ambulatory Visit: Payer: Self-pay | Admitting: Internal Medicine

## 2020-02-18 ENCOUNTER — Ambulatory Visit
Admission: RE | Admit: 2020-02-18 | Discharge: 2020-02-18 | Disposition: A | Discharge status: Home / Self Care | Payer: Managed Care, Other (non HMO) | Source: Ambulatory Visit | Attending: Internal Medicine | Admitting: Internal Medicine

## 2020-02-18 DIAGNOSIS — M542 Cervicalgia: Secondary | ICD-10-CM

## 2020-02-26 ENCOUNTER — Encounter: Payer: Self-pay | Admitting: Neurology

## 2020-03-05 ENCOUNTER — Ambulatory Visit: Payer: Managed Care, Other (non HMO) | Admitting: Allergy

## 2020-03-07 ENCOUNTER — Ambulatory Visit: Payer: Managed Care, Other (non HMO) | Attending: Internal Medicine

## 2020-03-07 DIAGNOSIS — Z23 Encounter for immunization: Secondary | ICD-10-CM

## 2020-03-07 NOTE — Progress Notes (Signed)
   Covid-19 Vaccination Clinic  Name:  MADALYNE HUSK    MRN: [id number] DOB: 1975-07-21  03/07/2020  Ms. Luthi was observed post Covid-19 immunization for 15 minutes without incident. She was provided with Vaccine Information Sheet and instruction to access the V-Safe system.   Ms. Delmar was instructed to call 911 with any severe reactions post vaccine: Marland Kitchen Difficulty breathing  . Swelling of face and throat  . A fast heartbeat  . A bad rash all over body  . Dizziness and weakness   Immunizations Administered    Name Date Dose VIS Date Route   Pfizer COVID-19 Vaccine 03/07/2020  2:34 PM 0.3 mL 01/16/2020 Intramuscular   Manufacturer: [employer], [employer]   Lot: [id number]   NDC: [address]-7

## 2020-05-19 ENCOUNTER — Encounter: Payer: Self-pay | Admitting: Neurology

## 2020-05-19 ENCOUNTER — Ambulatory Visit: Payer: Managed Care, Other (non HMO) | Admitting: Neurology

## 2020-08-01 ENCOUNTER — Ambulatory Visit: Payer: Managed Care, Other (non HMO) | Admitting: Neurology

## 2020-08-08 ENCOUNTER — Ambulatory Visit: Payer: Managed Care, Other (non HMO) | Admitting: Neurology

## 2020-09-05 ENCOUNTER — Encounter: Payer: Self-pay | Admitting: Neurology

## 2020-09-05 ENCOUNTER — Ambulatory Visit (INDEPENDENT_AMBULATORY_CARE_PROVIDER_SITE_OTHER): Payer: Managed Care, Other (non HMO) | Admitting: Neurology

## 2020-09-05 ENCOUNTER — Other Ambulatory Visit: Payer: Self-pay

## 2020-09-05 VITALS — BP 119/77 | HR 80 | Ht 66.5 in | Wt 211.0 lb

## 2020-09-05 DIAGNOSIS — M542 Cervicalgia: Secondary | ICD-10-CM

## 2020-09-05 DIAGNOSIS — R200 Anesthesia of skin: Secondary | ICD-10-CM

## 2020-09-05 DIAGNOSIS — R202 Paresthesia of skin: Secondary | ICD-10-CM | POA: Diagnosis not present

## 2020-09-05 MED ORDER — CYCLOBENZAPRINE HCL 5 MG PO TABS: 5.0000 mg | ORAL_TABLET | Freq: Every evening | ORAL | 1 refills | Status: DC | PRN

## 2020-09-05 NOTE — Patient Instructions (Signed)
Nerve testing of the left > right hands  Start physical therapy for neck pain  ELECTROMYOGRAM AND NERVE CONDUCTION STUDIES (EMG/NCS) INSTRUCTIONS  How to Prepare The neurologist conducting the EMG will need to know if you have certain medical conditions. Tell the neurologist and other EMG lab personnel if you: Have a pacemaker or any other electrical medical device Take blood-thinning medications Have hemophilia, a blood-clotting disorder that causes prolonged bleeding Bathing Take a shower or bath shortly before your exam in order to remove oils from your skin. Don't apply lotions or creams before the exam.  What to Expect You'll likely be asked to change into a hospital gown for the procedure and lie down on an examination table. The following explanations can help you understand what will happen during the exam.  Electrodes. The neurologist or a technician places surface electrodes at various locations on your skin depending on where you're experiencing symptoms. Or the neurologist may insert needle electrodes at different sites depending on your symptoms.  Sensations. The electrodes will at times transmit a tiny electrical current that you may feel as a twinge or spasm. The needle electrode may cause discomfort or pain that usually ends shortly after the needle is removed. If you are concerned about discomfort or pain, you may want to talk to the neurologist about taking a short break during the exam.  Instructions. During the needle EMG, the neurologist will assess whether there is any spontaneous electrical activity when the muscle is at rest - activity that isn't present in healthy muscle tissue - and the degree of activity when you slightly contract the muscle.  He or she will give you instructions on resting and contracting a muscle at appropriate times. Depending on what muscles and nerves the neurologist is examining, he or she may ask you to change positions during the exam.  After  your EMG You may experience some temporary, minor bruising where the needle electrode was inserted into your muscle. This bruising should fade within several days. If it persists, contact your primary care doctor.

## 2020-09-05 NOTE — Progress Notes (Signed)
[facility] Clinic Note - Initial Visit   Date: 09/05/20  Laura Schaefer MRN: [id number] DOB: 09/09/1975   Dear Dr. Donette Larry:  Thank you for your kind referral of [name] for consultation of left leg numbness. Although her history is well known to you, please allow [place] to reiterate it for the purpose of our medical record. The patient was accompanied to the clinic by self.    History of Present Illness: Laura Schaefer is a 45 y.o. right-handed female with hypertension and asthma presenting for evaluation of left leg numbness.  Patient also complains of neck pain and hand tingling. In late 2021, she had two episodes of waking up in the morning with left leg numbness, involving the entire leg.  She said the leg was so numb that she fell when getting out of bed.  Symptoms resolved within a few seconds. No weakness.  No face or left arm numbness. =She has not had spells in 2022.  She does not recall sleeping on it differently.   She complains of left sided neck pain, worse with neck rotation.  Pain is throbbing and achy.  She has not done physical therapy.  She takes Advil for pain which helps when she takes it.  She has tried many different pillows and a new mattress, but nothing gives her relief. She wakes up in the morning with her hands falling asleep, which often resolves with shaking her hands. No weakness in the arms.   She works in [employer], industrial/product.   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     budesonide-formoterol (SYMBICORT)  160-4.5 MCG/ACT inhaler 2 puffs   hydrochlorothiazide (MICROZIDE) 12.5 MG capsule Take 12.5 mg by mouth daily.   valACYclovir (VALTREX) 500 MG tablet    [DISCONTINUED] fluticasone-salmeterol (ADVAIR HFA) 115-21 MCG/ACT inhaler Inhale 2 puffs into the lungs 2 (two) times daily. rinse mouth afterwards.   No facility-administered encounter medications on file as of 09/05/2020.    Allergies:  Allergies  Allergen Reactions   Penicillins Hives and Palpitations    Family History: Family History  Problem Relation Age of Onset   Hypertension Mother    Asthma Mother    Hypertension Father    Diabetes Sister    Hypertension Sister    Diabetes Paternal Grandmother    Hypertension Paternal Grandmother    Cancer Paternal Grandfather     Social History: Social History   Tobacco Use   Smoking status: Never   Smokeless tobacco: Never  Vaping Use   Vaping Use: Never used  Substance Use Topics   Alcohol use: No   Drug use: No   Social History   Social History Narrative   Living with husband and 2 children   Right hand   Drink caffeine bottle daily.    Vital Signs:  BP 119/77   Pulse 80   Ht 5' 6.5" (1.689 m)   Wt 211 lb (95.7 kg)   SpO2  97%   BMI 33.55 kg/m   Neurological Exam: MENTAL STATUS including orientation to time, place, person, recent and remote memory, attention span and concentration, language, and fund of knowledge is normal.  Speech is not dysarthric.  CRANIAL NERVES: II:  No visual field defects.    III-IV-VI: Pupils equal round and reactive to light.  Normal conjugate, extra-ocular eye movements in all directions of gaze.  No nystagmus.  No ptosis.   V:  Normal facial sensation.    VII:  Normal facial symmetry and movements.   VIII:  Normal hearing and vestibular function.   IX-X:  Normal palatal movement.   XI:  Normal shoulder shrug and head rotation.   XII:  Normal tongue strength and range of motion, no deviation or fasciculation.  MOTOR:  Reduced  neck ROM with rotation especially to the right.  She had neck tenderness over the cervical muscles on the left. No atrophy, fasciculations or abnormal movements.  No pronator drift.   Upper Extremity:  Right  Left  Deltoid  5/5   5/5   Biceps  5/5   5/5   Triceps  5/5   5/5   Infraspinatus 5/5  5/5  Medial pectoralis 5/5  5/5  Wrist extensors  5/5   5/5   Wrist flexors  5/5   5/5   Finger extensors  5/5   5/5   Finger flexors  5/5   5/5   Dorsal interossei  5/5   5/5   Abductor pollicis  5/5   5/5   Tone (Ashworth scale)  0  0   Lower Extremity:  Right  Left  Hip flexors  5/5   5/5   Hip extensors  5/5   5/5   Adductor 5/5  5/5  Abductor 5/5  5/5  Knee flexors  5/5   5/5   Knee extensors  5/5   5/5   Dorsiflexors  5/5   5/5   Plantarflexors  5/5   5/5   Toe extensors  5/5   5/5   Toe flexors  5/5   5/5   Tone (Ashworth scale)  0  0   MSRs:  Right        Left                  brachioradialis 2+  2+  biceps 2+  2+  triceps 2+  2+  patellar 2+  2+  ankle jerk 2+  2+  Hoffman no  no  plantar response down  down   SENSORY:  Normal and symmetric perception of light touch, pinprick, vibration, and proprioception.  Romberg's sign absent. Tinel's sign is negative at the wrists.  COORDINATION/GAIT: Normal finger-to- nose-finger and heel-to-shin.  Intact rapid alternating movements bilaterally.  Able to rise from a chair without using arms.  Gait narrow based and stable. Tandem and stressed gait intact.    IMPRESSION: Cervicalgia - Start neck PT - Start flexeril 5mg  at bedtime  Bilateral hand paresthesias, ?carpal tunnel syndrome - NCS/EMG of the arms  Left leg numbness, transient.  Normal exam. Symptoms improved with respostioning suggesting that it may be secondary to transient nerve compression.  If symptoms recur, consider NCS/EMG - Continue to follow   Return to clinic in 4 months.    Thank you for allowing me to participate in patient's care.  If I can answer any  additional questions, I would be pleased to do so.    Sincerely,    Ahmari Duerson K.  Posey Pronto, DO

## 2020-09-10 NOTE — Addendum Note (Signed)
Addended by: Karl Luke A on: 09/10/2020 04:25 PM   Modules accepted: Orders

## 2020-10-04 IMAGING — US ULTRASOUND RIGHT BREAST LIMITED
1 series · 2 of 2 positions shown · non-contrast
Comparison: Previous exam(s).

CLINICAL DATA: Pain behind the right nipple.

EXAM:
DIGITAL DIAGNOSTIC BILATERAL MAMMOGRAM WITH CAD AND TOMO
ULTRASOUND RIGHT BREAST

[Series 1: ultrasound right breast limited · 0.07mm/px · 2 of 2 slices shown]
[im 1/2]
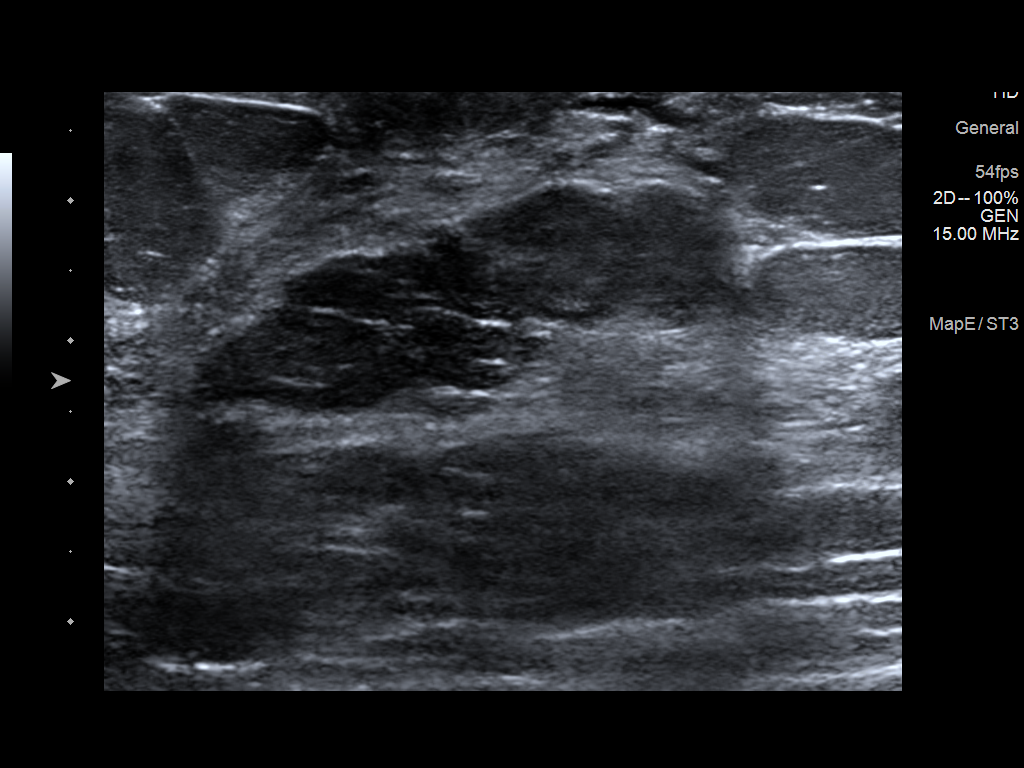
[im 2/2]
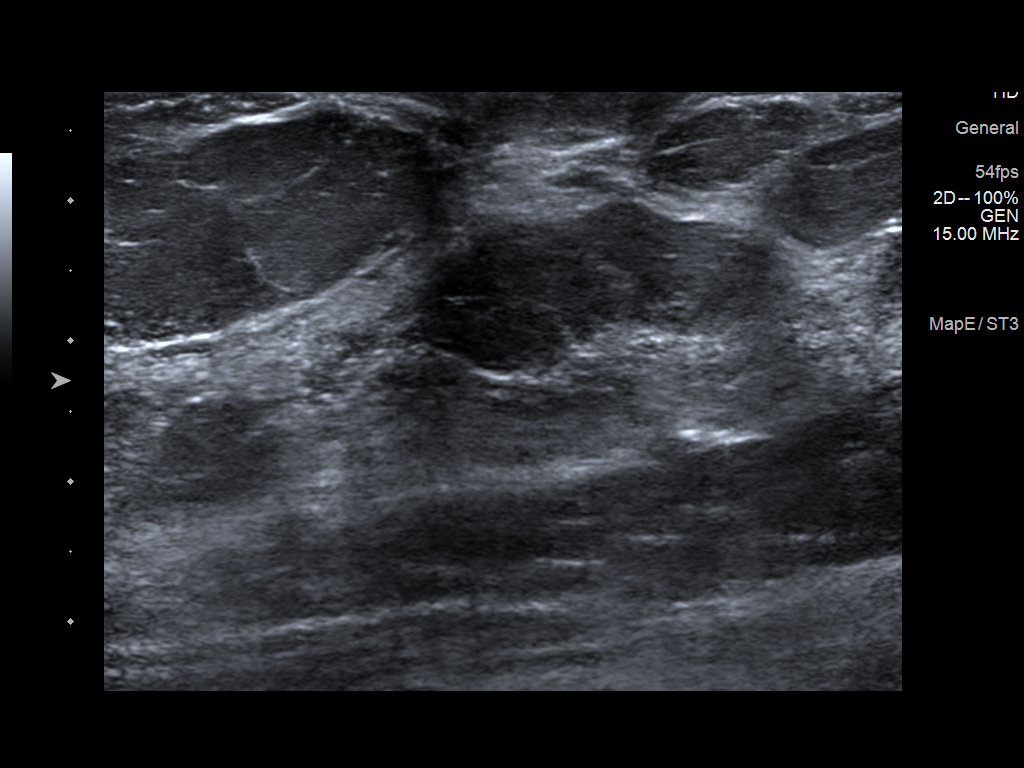

[2 of 2 positions shown; findings below may reference images not displayed]

ACR Breast Density Category b: There are scattered areas of
fibroglandular density.
FINDINGS: No suspicious masses, calcifications, or distortion in either
breast. Specifically, no abnormalities are seen behind the right
nipple.

Mammographic images were processed with CAD.

On physical exam, no suspicious lumps are identified.

Targeted ultrasound is performed, showing no sonographic correlate
for the patient's intermittent breast pain.
IMPRESSION: No mammographic or sonographic evidence of malignancy. No cause for
the patient's intermittent breast pain identified. The patient does
not have pain today.

RECOMMENDATION:
Treatment of the patient's symptoms should be based on clinical and
physical exam given lack of imaging findings. Recommend annual
screening mammography.

I have discussed the findings and recommendations with the patient.
Results were also provided in writing at the conclusion of the
visit. If applicable, a reminder letter will be sent to the patient
regarding the next appointment.

BI-RADS CATEGORY  1: Negative.

## 2020-10-09 ENCOUNTER — Ambulatory Visit: Payer: Managed Care, Other (non HMO) | Admitting: Physical Therapy

## 2020-11-04 ENCOUNTER — Ambulatory Visit (INDEPENDENT_AMBULATORY_CARE_PROVIDER_SITE_OTHER): Payer: Managed Care, Other (non HMO) | Admitting: Neurology

## 2020-11-04 ENCOUNTER — Ambulatory Visit: Payer: Managed Care, Other (non HMO) | Admitting: Physical Therapy

## 2020-11-04 ENCOUNTER — Other Ambulatory Visit: Payer: Self-pay

## 2020-11-04 DIAGNOSIS — M542 Cervicalgia: Secondary | ICD-10-CM | POA: Diagnosis not present

## 2020-11-04 DIAGNOSIS — G5623 Lesion of ulnar nerve, bilateral upper limbs: Secondary | ICD-10-CM

## 2020-11-04 NOTE — Procedures (Signed)
[facility]  [address], [address]  [place], [place] [address] Tel: [phone] Fax:  [phone] Test Date:  11/04/2020  Patient: Laura Schaefer DOB: [birth year] Physician: Nita Sickle, DO  Sex: Female Height: 5\' 6"  Ref Phys: , DO  ID#: Nita Sickle   Technician:    Patient Complaints: This is a 45 year old female referred for evaluation of bilateral hand paresthesias.  NCV & EMG Findings: Extensive electrodiagnostic testing of the right upper extremity and additional studies of the left shows:  Bilateral median, ulnar, and mixed palmar sensory responses are within normal limits. Bilateral median motor responses are within normal limits.  Bilateral ulnar motor responses show reduced amplitude (R6.6, L6.9 mV) and decreased conduction velocity across the elbow (A Elbow-B Elbow, R53, L53 m/s).   Sparse chronic motor axonal loss changes are seen affecting the ulnar innervated muscles, without accompanying active denervation.    Impression: Bilateral ulnar neuropathy with slowing across the elbow, demyelinating with axonal features, moderate. There is no evidence of carpal tunnel syndrome or cervical radiculopathy affecting the upper extremities.   ___________________________ 48, DO    Nerve Conduction Studies Anti Sensory Summary Table   Stim Site NR Peak (ms) Norm Peak (ms) P-T Amp (V) Norm P-T Amp  Left Median Anti Sensory (2nd Digit)  35C  Wrist    2.9 <3.8 58.3 >10  Right Median Anti Sensory (2nd Digit)  35C  Wrist    3.1 <3.8 49.3 >10  Left Ulnar Anti Sensory (5th Digit)  35C  Wrist    2.8 <3.2 62.4 >5  Right Ulnar Anti Sensory (5th Digit)  35C  Wrist    2.6 <3.2 49.5 >5   Motor Summary Table   Stim Site NR Onset (ms) Norm Onset (ms) O-P Amp (mV) Norm O-P Amp Site1 Site2 Delta-0 (ms) Dist (cm) Vel (m/s) Norm Vel (m/s)  Left Median Motor (Abd Poll Brev)  35C  Wrist    2.7 <4.0 9.9 >5 Elbow Wrist 4.8 29.0 60 >50  Elbow    7.5  9.5          Right Median Motor (Abd Poll Brev)  35C  Wrist    3.0 <4.0 10.0 >5 Elbow Wrist 4.7 28.0 60 >50  Elbow    7.7  10.0         Left Ulnar Motor (Abd Dig Minimi)  35C  Wrist    2.2 <3.1 6.9 >7 B Elbow Wrist 3.2 24.0 75 >50  B Elbow    5.4  6.7  A Elbow B Elbow 1.9 10.0 53 >50  A Elbow    7.3  6.6         Right Ulnar Motor (Abd Dig Minimi)  35C  Wrist    2.2 <3.1 6.6 >7 B Elbow Wrist 3.3 23.0 70 >50  B Elbow    5.5  6.2  A Elbow B Elbow 1.9 10.0 53 >50  A Elbow    7.4  6.1          Comparison Summary Table   Stim Site NR Peak (ms) Norm Peak (ms) P-T Amp (V) Site1 Site2 Delta-P (ms) Norm Delta (ms)  Left Median/Ulnar Palm Comparison (Wrist - 8cm)  35C  Median Palm    1.5 <2.2 59.7 Median Palm Ulnar Palm 0.0   Ulnar Palm    1.5 <2.2 30.6      Right Median/Ulnar Palm Comparison (Wrist - 8cm)  35C  Median Palm    1.8 <2.2 51.4 Median  Palm Ulnar Palm 0.3   Ulnar Palm    1.5 <2.2 15.1       EMG   Side Muscle Ins Act Fibs Psw Fasc Number Recrt Dur Dur. Amp Amp. Poly Poly. Comment  Left 1stDorInt Nml Nml Nml Nml 1- Rapid Few 1+ Few 1+ Nml Nml N/A  Left PronatorTeres Nml Nml Nml Nml Nml Nml Nml Nml Nml Nml Nml Nml N/A  Left Biceps Nml Nml Nml Nml Nml Nml Nml Nml Nml Nml Nml Nml N/A  Left Triceps Nml Nml Nml Nml Nml Nml Nml Nml Nml Nml Nml Nml N/A  Left Deltoid Nml Nml Nml Nml Nml Nml Nml Nml Nml Nml Nml Nml N/A  Left ABD Dig Min Nml Nml Nml Nml 1- Rapid Few 1+ Few 1+ Nml Nml N/A  Left FlexCarpiUln Nml Nml Nml Nml Nml Nml Nml Nml Nml Nml Nml Nml N/A  Right 1stDorInt Nml Nml Nml Nml 1- Rapid Few 1+ Few 1+ Nml Nml N/A  Right PronatorTeres Nml Nml Nml Nml Nml Nml Nml Nml Nml Nml Nml Nml N/A  Right Biceps Nml Nml Nml Nml Nml Nml Nml Nml Nml Nml Nml Nml N/A  Right Triceps Nml Nml Nml Nml Nml Nml Nml Nml Nml Nml Nml Nml N/A  Right Deltoid Nml Nml Nml Nml Nml Nml Nml Nml Nml Nml Nml Nml N/A  Right ABD Dig Min Nml Nml Nml Nml 1- Rapid Few 1+ Few 1+ Nml Nml N/A  Right FlexDigProf 4,5 Nml  Nml Nml Nml Nml Nml Nml Nml Nml Nml Nml Nml N/A      Waveforms:

## 2020-11-05 NOTE — Progress Notes (Signed)
Lm w/ pt on her way in to work, wcb to sched/[place]

## 2020-12-01 ENCOUNTER — Other Ambulatory Visit: Payer: Self-pay

## 2020-12-01 ENCOUNTER — Encounter (HOSPITAL_BASED_OUTPATIENT_CLINIC_OR_DEPARTMENT_OTHER): Payer: Self-pay | Admitting: *Deleted

## 2020-12-01 ENCOUNTER — Emergency Department (HOSPITAL_BASED_OUTPATIENT_CLINIC_OR_DEPARTMENT_OTHER)
Admission: EM | Admit: 2020-12-01 | Discharge: 2020-12-01 | Disposition: A | Discharge status: Home / Self Care | Payer: Managed Care, Other (non HMO) | Attending: Emergency Medicine | Admitting: Emergency Medicine

## 2020-12-01 DIAGNOSIS — J45909 Unspecified asthma, uncomplicated: Secondary | ICD-10-CM | POA: Diagnosis not present

## 2020-12-01 DIAGNOSIS — Z79899 Other long term (current) drug therapy: Secondary | ICD-10-CM | POA: Insufficient documentation

## 2020-12-01 DIAGNOSIS — R519 Headache, unspecified: Secondary | ICD-10-CM | POA: Diagnosis present

## 2020-12-01 DIAGNOSIS — I10 Essential (primary) hypertension: Secondary | ICD-10-CM | POA: Insufficient documentation

## 2020-12-01 DIAGNOSIS — U071 COVID-19: Secondary | ICD-10-CM | POA: Diagnosis not present

## 2020-12-01 MED ORDER — DEXAMETHASONE SODIUM PHOSPHATE 10 MG/ML IJ SOLN
8.0000 mg | Freq: Once | INTRAMUSCULAR | Status: DC
  Filled 2020-12-01: qty 1

## 2020-12-01 MED ORDER — DEXAMETHASONE SODIUM PHOSPHATE 10 MG/ML IJ SOLN
6.0000 mg | Freq: Once | INTRAMUSCULAR | Status: AC
  Administered 2020-12-01: 6 mg via INTRAMUSCULAR

## 2020-12-01 MED ORDER — DIPHENHYDRAMINE HCL 50 MG/ML IJ SOLN
25.0000 mg | Freq: Once | INTRAMUSCULAR | Status: AC
  Administered 2020-12-01: 25 mg via INTRAVENOUS
  Filled 2020-12-01: qty 1

## 2020-12-01 MED ORDER — SODIUM CHLORIDE 0.9 % IV BOLUS
500.0000 mL | Freq: Once | INTRAVENOUS | Status: AC
  Administered 2020-12-01: 500 mL via INTRAVENOUS

## 2020-12-01 MED ORDER — METOCLOPRAMIDE HCL 5 MG/ML IJ SOLN
10.0000 mg | Freq: Once | INTRAMUSCULAR | Status: AC
  Administered 2020-12-01: 10 mg via INTRAVENOUS
  Filled 2020-12-01: qty 2

## 2020-12-01 NOTE — ED Notes (Signed)
Full frontal headache not sensitive to light.  States hurts from the neck up.  Denies SOB.  Dry cough.

## 2020-12-01 NOTE — ED Triage Notes (Signed)
Pt Covid Positive yesterday after developing symptoms on Saturday. C/O headache not relieved with Advil

## 2020-12-01 NOTE — Discharge Instructions (Signed)
Please continue to monitor symptoms closely.  If you develop any new or worsening symptoms please come back to the emergency department immediately for reevaluation.  It was a pleasure to meet you.

## 2020-12-01 NOTE — ED Notes (Signed)
Patient states headache is better. 

## 2020-12-01 NOTE — ED Provider Notes (Signed)
[facility] EMERGENCY DEPT Provider Note   CSN: [id number] Arrival date & time: 12/01/20  1232     History Chief Complaint  Patient presents with   Covid Positive   Headache    Laura Schaefer is a 45 y.o. female.  HPI Patient is a 45 year old female who presents to the emergency department due to a headache.  Patient states that about 2 days ago she began experiencing a headache, dry cough, as well as fatigue.  She tested positive for COVID-19 yesterday.  States her headache was gradual onset and is currently about an 8/10.  Denies any chest pain, shortness of breath.  Patient notes some mild nausea and decreased appetite but no vomiting or diarrhea.  Denies any weakness, numbness, visual changes.  She states she has been vaccinated for COVID-19 x3 and also notes a previous COVID-19 infection in 2020.    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   Herpes    Hypertension    No pertinent past medical history     Patient Active Problem List   Diagnosis Date Noted   Bee sting reaction 11/26/2019   Moderate persistent asthma without complication 11/26/2019   Other allergic rhinitis 11/26/2019    Past Surgical History:  Procedure Laterality Date   toncillectomy     TONSILLECTOMY       OB History     Gravida  4   Para  3   Term  3   Preterm  0   AB  1   Living  3      SAB  0   IAB  0   Ectopic  0   Multiple  0   Live Births  3           Family History  Problem Relation Age of Onset   Hypertension Mother    Asthma Mother    Hypertension Father    Diabetes Sister    Hypertension Sister    Diabetes Paternal Grandmother    Hypertension Paternal Grandmother    Cancer Paternal Grandfather     Social History   Tobacco Use   Smoking status: Never   Smokeless tobacco: Never  Vaping Use   Vaping Use: Never used  Substance Use Topics   Alcohol use: No   Drug  use: No    Home Medications Prior to Admission medications   Medication Sig Start Date End Date Taking? Authorizing Provider  budesonide-formoterol (SYMBICORT) 160-4.5 MCG/ACT inhaler 2 puffs   Yes [provider]  hydrochlorothiazide (MICROZIDE) 12.5 MG capsule Take 12.5 mg by mouth daily.   Yes [provider]  albuterol (PROVENTIL,VENTOLIN) 90 MCG/ACT inhaler Inhale 2 puffs into the lungs every 6 (six) hours as needed.      [provider]  cyclobenzaprine (FLEXERIL) 5 MG tablet Take 1 tablet (5 mg total) by mouth at bedtime as needed for muscle spasms. 09/05/20   Glendale Chard, DO  valACYclovir (VALTREX) 500 MG tablet  08/26/13   [provider]    Allergies    Penicillins  Review of Systems   Review of Systems  All other systems reviewed and are negative. Ten systems reviewed and are negative for acute change, except as noted in the HPI.   Physical Exam Updated Vital Signs BP 117/74   Pulse 69   Temp 98.8 F (37.1 C)  Resp 18   Ht 5\' 6"  (1.676 m)   Wt 99.8 kg   SpO2 99%   BMI 35.51 kg/m   Physical Exam Vitals and nursing note reviewed.  Constitutional:      General: She is not in acute distress.    Appearance: Normal appearance. She is not ill-appearing, toxic-appearing or diaphoretic.  HENT:     Head: Normocephalic and atraumatic.     Right Ear: External ear normal.     Left Ear: External ear normal.     Nose: Nose normal.     Mouth/Throat:     Mouth: Mucous membranes are moist.     Pharynx: Oropharynx is clear. No oropharyngeal exudate or posterior oropharyngeal erythema.  Eyes:     General: No scleral icterus.    Extraocular Movements: Extraocular movements intact.     Right eye: Normal extraocular motion and no nystagmus.     Left eye: Normal extraocular motion and no nystagmus.     Pupils: Pupils are equal, round, and reactive to light. Pupils are equal.     Right eye: Pupil is round and reactive.     Left eye: Pupil  is round and reactive.     Comments: Extraocular movements are intact.  Pupils are equal, round, and reactive to light.  Cardiovascular:     Rate and Rhythm: Normal rate and regular rhythm.     Pulses: Normal pulses.     Heart sounds: Normal heart sounds. No murmur heard.   No friction rub. No gallop.  Pulmonary:     Effort: Pulmonary effort is normal. No respiratory distress.     Breath sounds: Normal breath sounds. No stridor. No wheezing, rhonchi or rales.  Abdominal:     General: Abdomen is flat.     Palpations: Abdomen is soft.     Tenderness: There is no abdominal tenderness.  Musculoskeletal:        General: Normal range of motion.     Cervical back: Normal range of motion and neck supple. No rigidity or tenderness.  Skin:    General: Skin is warm and dry.  Neurological:     General: No focal deficit present.     Mental Status: She is alert and oriented to person, place, and time.     GCS: GCS eye subscore is 4. GCS verbal subscore is 5. GCS motor subscore is 6.     Comments: Patient is oriented to person, place, and time. Patient phonates in clear, complete, and coherent sentences. Negative arm drift. Strength is 5/5 in all four extremities. Distal sensation intact in all four extremities.  Psychiatric:        Mood and Affect: Mood normal.        Behavior: Behavior normal.   ED Results / Procedures / Treatments   Labs (all labs ordered are listed, but only abnormal results are displayed) Labs Reviewed - No data to display  EKG None  Radiology No results found.  Procedures Procedures   Medications Ordered in ED Medications  dexamethasone (DECADRON) injection 8 mg (has no administration in time range)  metoCLOPramide (REGLAN) injection 10 mg (10 mg Intravenous Given 12/01/20 1358)  diphenhydrAMINE (BENADRYL) injection 25 mg (25 mg Intravenous Given 12/01/20 1358)  sodium chloride 0.9 % bolus 500 mL (0 mLs Intravenous Stopped 12/01/20 1505)    ED Course  I have  reviewed the triage vital signs and the nursing notes.  Pertinent labs & imaging results that were available during my care of the  patient were reviewed by me and considered in my medical decision making (see chart for details).    MDM Rules/Calculators/A&P                          Pt is a 45 y.o. female who presents to the emergency department due to a headache since being diagnosed with COVID-19 yesterday.  Physical exam was reassuring.  Neurological exam is benign.  Moving all 4 extremities with ease.  Speaking clearly, coherently, and in complete sentences.  No gross deficits.  Distal sensation intact.  Strength is 5/5 in all 4 extremities.  Extraocular movements are intact.  Pupils are equal, round, and reactive to light.  This is the patient's second bout of COVID-19 and she reports similar headaches with her first COVID-19 infection.  Patient given a migraine cocktail and notes significant relief in her pain.  She states that she was at an 8/10 upon arrival and is now currently about a 2/10.  We will also give a dose of Decadron due to her current COVID-19 infection as well as to prevent rebound headache.  She denies a history of diabetes mellitus.  Feel the patient is stable for discharge at this time and she is agreeable.  We discussed return precautions in length.  Her questions were answered and she was amicable at the time of discharge.  Note: Portions of this report may have been transcribed using voice recognition software. Every effort was made to ensure accuracy; however, inadvertent computerized transcription errors may be present.   Final Clinical Impression(s) / ED Diagnoses Final diagnoses:  COVID-19  Bad headache    Rx / DC Orders ED Discharge Orders     None        Placido Sou, PA-C 12/01/20 1522    Tanda Rockers A, DO 12/02/20 1422

## 2020-12-05 ENCOUNTER — Encounter (HOSPITAL_BASED_OUTPATIENT_CLINIC_OR_DEPARTMENT_OTHER): Payer: Self-pay | Admitting: *Deleted

## 2020-12-05 ENCOUNTER — Emergency Department (HOSPITAL_BASED_OUTPATIENT_CLINIC_OR_DEPARTMENT_OTHER)
Admission: EM | Admit: 2020-12-05 | Discharge: 2020-12-05 | Disposition: A | Discharge status: Home / Self Care | Payer: Managed Care, Other (non HMO) | Attending: Emergency Medicine | Admitting: Emergency Medicine

## 2020-12-05 ENCOUNTER — Emergency Department (HOSPITAL_BASED_OUTPATIENT_CLINIC_OR_DEPARTMENT_OTHER): Payer: Managed Care, Other (non HMO)

## 2020-12-05 ENCOUNTER — Other Ambulatory Visit: Payer: Self-pay

## 2020-12-05 DIAGNOSIS — Z7951 Long term (current) use of inhaled steroids: Secondary | ICD-10-CM | POA: Insufficient documentation

## 2020-12-05 DIAGNOSIS — U071 COVID-19: Secondary | ICD-10-CM | POA: Diagnosis not present

## 2020-12-05 DIAGNOSIS — I10 Essential (primary) hypertension: Secondary | ICD-10-CM | POA: Diagnosis not present

## 2020-12-05 DIAGNOSIS — J4540 Moderate persistent asthma, uncomplicated: Secondary | ICD-10-CM | POA: Insufficient documentation

## 2020-12-05 DIAGNOSIS — R519 Headache, unspecified: Secondary | ICD-10-CM | POA: Diagnosis present

## 2020-12-05 DIAGNOSIS — Z79899 Other long term (current) drug therapy: Secondary | ICD-10-CM | POA: Insufficient documentation

## 2020-12-05 DIAGNOSIS — E876 Hypokalemia: Secondary | ICD-10-CM | POA: Diagnosis not present

## 2020-12-05 DIAGNOSIS — R5381 Other malaise: Secondary | ICD-10-CM | POA: Diagnosis not present

## 2020-12-05 LAB — COMPREHENSIVE METABOLIC PANEL
ALT: 7 U/L (ref 0–44)
AST: 12 U/L — ABNORMAL LOW (ref 15–41)
Albumin: 4.1 g/dL (ref 3.5–5.0)
Alkaline Phosphatase: 53 U/L (ref 38–126)
Anion gap: 9 (ref 5–15)
BUN: 5 mg/dL — ABNORMAL LOW (ref 6–20)
CO2: 32 mmol/L (ref 22–32)
Calcium: 9.4 mg/dL (ref 8.9–10.3)
Chloride: 98 mmol/L (ref 98–111)
Creatinine, Ser: 0.73 mg/dL (ref 0.44–1.00)
GFR, Estimated: >60 mL/min (ref >60)
Glucose, Bld: 98 mg/dL (ref 70–99)
Potassium: 3 mmol/L — ABNORMAL LOW (ref 3.5–5.1)
Sodium: 139 mmol/L (ref 135–145)
Total Bilirubin: 0.2 mg/dL — ABNORMAL LOW (ref 0.3–1.2)
Total Protein: 7.5 g/dL (ref 6.5–8.1)

## 2020-12-05 LAB — CBC WITH DIFFERENTIAL/PLATELET
Abs Immature Granulocytes: 0.01 10*3/uL (ref 0.00–0.07)
Basophils Absolute: 0 10*3/uL (ref 0.0–0.1)
Basophils Relative: 0 %
Eosinophils Absolute: 0.1 10*3/uL (ref 0.0–0.5)
Eosinophils Relative: 1 %
HCT: 36.2 % (ref 36.0–46.0)
Hemoglobin: 11.1 g/dL — ABNORMAL LOW (ref 12.0–15.0)
Immature Granulocytes: 0 %
Lymphocytes Relative: 40 %
Lymphs Abs: 2.4 10*3/uL (ref 0.7–4.0)
MCH: 22.4 pg — ABNORMAL LOW (ref 26.0–34.0)
MCHC: 30.7 g/dL (ref 30.0–36.0)
MCV: 73.1 fL — ABNORMAL LOW (ref 80.0–100.0)
Monocytes Absolute: 0.4 10*3/uL (ref 0.1–1.0)
Monocytes Relative: 7 %
Neutro Abs: 3.1 10*3/uL (ref 1.7–7.7)
Neutrophils Relative %: 52 %
Platelets: 349 10*3/uL (ref 150–400)
RBC: 4.95 MIL/uL (ref 3.87–5.11)
RDW: 15.6 % — ABNORMAL HIGH (ref 11.5–15.5)
WBC: 6 10*3/uL (ref 4.0–10.5)
nRBC: 0 % (ref 0.0–0.2)

## 2020-12-05 LAB — D-DIMER, QUANTITATIVE: D-Dimer, Quant: 0.5 ug/mL-FEU (ref 0.00–0.50)

## 2020-12-05 MED ORDER — POTASSIUM CHLORIDE CRYS ER 20 MEQ PO TBCR: 20.0000 meq | EXTENDED_RELEASE_TABLET | Freq: Every day | ORAL | 0 refills | Status: AC

## 2020-12-05 NOTE — ED Triage Notes (Signed)
Patient came in stating that she was tested for Covid + on Saturday.  Came in today that something is off.  Feeling off, "something is off."

## 2020-12-05 NOTE — Discharge Instructions (Addendum)
Your laboratory results were within normal limits today aside from your potassium level.  You were provided with a prescription for potassium replacement, please take 1 tablet daily for the next 7 days.  You may also have your potassium level checked by your primary care physician.   The chest x-ray was also negative for any pneumonia.  You may continue to your symptoms at home.

## 2020-12-05 NOTE — ED Provider Notes (Signed)
[facility] EMERGENCY DEPT Provider Note   CSN: [id number] Arrival date & time: 12/05/20  1053     History Chief Complaint  Patient presents with   Feeling off    Laura Schaefer is a 45 y.o. female.  45 y.o female with a PMH of angioedema, asthma, depression presents to the ED with a chief complaint of "something feels ".  Patient tested positive for COVID-19 approximately 5 days ago in the ED, she is been treating her symptoms at home with symptomatic treatment such as Mucinex, Tylenol, Excedrin.  Her symptoms consist of headaches, nausea but no vomiting.  Dates that she feels "something feels like it is ", felt the same way when she had her baby, and blood pressure had dropped.  She also endorses some shortness of breath, the symptoms wax and wane, made worse with coughing.  She took some Excedrin yesterday for her migraine which she reports made her feel " and jittery ".  She also has been taking Mucinex in addition.  States she did not take any medication today aside from her blood pressure medication.  Endorses feeling somewhat very lightheaded, like she is "going to pass out when she lays down".  She does not have any chest pain, fever, other complaints.        The history is provided by the patient and medical records.      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   Herpes    Hypertension    No pertinent past medical history     Patient Active Problem List   Diagnosis Date Noted   Bee sting reaction 11/26/2019   Moderate persistent asthma without complication 11/26/2019   Other allergic rhinitis 11/26/2019    Past Surgical History:  Procedure Laterality Date   toncillectomy     TONSILLECTOMY       OB History     Gravida  4   Para  3   Term  3   Preterm  0   AB  1   Living  3      SAB  0   IAB  0   Ectopic  0   Multiple  0   Live Births  3            Family History  Problem Relation Age of Onset   Hypertension Mother    Asthma Mother    Hypertension Father    Diabetes Sister    Hypertension Sister    Diabetes Paternal Grandmother    Hypertension Paternal Grandmother    Cancer Paternal Grandfather     Social History   Tobacco Use   Smoking status: Never   Smokeless tobacco: Never  Vaping Use   Vaping Use: Never used  Substance Use Topics   Alcohol use: No   Drug use: No    Home Medications Prior to Admission medications   Medication Sig Start Date End Date Taking? Authorizing Provider  potassium chloride SA (KLOR-CON) 20 MEQ tablet Take 1 tablet (20 mEq total) by mouth daily for 7 doses. 12/05/20 12/12/20 Yes Charnetta Wulff, Leonie Douglas, PA-C  albuterol (PROVENTIL,VENTOLIN) 90 MCG/ACT inhaler Inhale 2 puffs into the lungs every 6 (six) hours as needed.      [provider]  budesonide-formoterol (SYMBICORT) 160-4.5 MCG/ACT inhaler 2 puffs    [provider]  cyclobenzaprine (FLEXERIL) 5 MG tablet Take  1 tablet (5 mg total) by mouth at bedtime as needed for muscle spasms. 09/05/20   Nita Sickle K, DO  hydrochlorothiazide (MICROZIDE) 12.5 MG capsule Take 12.5 mg by mouth daily.    [provider]  valACYclovir (VALTREX) 500 MG tablet  08/26/13   [provider]    Allergies    Penicillins  Review of Systems   Review of Systems  Constitutional:  Negative for fever.  HENT:  Negative for sore throat.   Respiratory:  Positive for shortness of breath.   Cardiovascular:  Negative for chest pain and leg swelling.  Gastrointestinal:  Positive for nausea. Negative for abdominal pain, constipation and vomiting.  Genitourinary:  Negative for flank pain.  Musculoskeletal:  Negative for back pain, neck pain and neck stiffness.  Skin:  Negative for pallor and wound.  Neurological:  Positive for weakness and light-headedness. Negative for dizziness, facial asymmetry and headaches.  All other systems reviewed  and are negative.  Physical Exam Updated Vital Signs BP 121/90 (BP Location: Right Arm)   Pulse 69   Temp 98.7 F (37.1 C) (Oral)   Resp 14   Ht 5\' 6"  (1.676 m)   Wt 99.8 kg   LMP 11/24/2020   SpO2 100%   BMI 35.51 kg/m   Physical Exam Vitals and nursing note reviewed.  Constitutional:      Appearance: Normal appearance.  HENT:     Head: Normocephalic and atraumatic.     Nose: Nose normal.     Mouth/Throat:     Mouth: Mucous membranes are moist.     Comments: Oropharynx is clear without any tonsillar exudates or erythema. Cardiovascular:     Rate and Rhythm: Normal rate.  Pulmonary:     Effort: Pulmonary effort is normal.     Breath sounds: No wheezing.     Comments: No rales or wheezing. Abdominal:     General: Abdomen is flat.     Tenderness: There is no right CVA tenderness, left CVA tenderness or guarding.  Musculoskeletal:     Cervical back: Normal range of motion and neck supple.  Lymphadenopathy:     Cervical: No cervical adenopathy.  Skin:    General: Skin is warm and dry.  Neurological:     Mental Status: She is alert and oriented to person, place, and time.     Comments: Moves all upper and lower extremities.    ED Results / Procedures / Treatments   Labs (all labs ordered are listed, but only abnormal results are displayed) Labs Reviewed  CBC WITH DIFFERENTIAL/PLATELET - Abnormal; Notable for the following components:      Result Value   Hemoglobin 11.1 (*)    MCV 73.1 (*)    MCH 22.4 (*)    RDW 15.6 (*)    All other components within normal limits  COMPREHENSIVE METABOLIC PANEL - Abnormal; Notable for the following components:   Potassium 3.0 (*)    BUN 5 (*)    AST 12 (*)    Total Bilirubin 0.2 (*)    All other components within normal limits  D-DIMER, QUANTITATIVE    EKG None  Radiology DG Chest Portable 1 View  Result Date: 12/05/2020 CLINICAL DATA:  Shortness of breath. EXAM: PORTABLE CHEST 1 VIEW COMPARISON:  02/27/2016 FINDINGS:  Single-view of the chest demonstrates clear lungs. Heart and mediastinum are within normal limits. Trachea is midline. Negative for a pneumothorax. Bony structures are unremarkable. IMPRESSION: No active disease. Electronically Signed   By: 14/03/2015  Lowella Dandy M.D.   On: 12/05/2020 14:24    Procedures Procedures   Medications Ordered in ED Medications - No data to display  ED Course  I have reviewed the triage vital signs and the nursing notes.  Pertinent labs & imaging results that were available during my care of the patient were reviewed by me and considered in my medical decision making (see chart for details).    MDM Rules/Calculators/A&P   Patient presents to the ED with a chief complaint of "something feels off ", tested positive for COVID-19 approximately 5 days ago, reports she is felt somewhat lightheaded, weak, feels like she is going to pass out.  She did have a fever on the first day, however has been afebrile for the rest of the time.  Her symptoms also consistent with headache, sore throat, she is been keeping her symptoms at bay with Tylenol and Mucinex at home.  On today's visit, she is concerned for "feeling lightheaded, like something is off ".  We did discuss checking electrolytes, chest x-ray to rule out any pneumonia along with EKG.  She does not have any prior history of cardiac disease, non-smoker, family history of cardiac disease remarkable for grandmother with previous MIs along with DVTs.  No prior history of blood clot in her, no clotting disorder reported.  On arrival vitals are within normal limits, she is afebrile, no tachycardia or hypoxia.  Exam is benign, lungs are clear without any rales or wheezing auscultated.  Oropharynx is clear without any erythema, tonsillar exudates.  Abdomen is soft, nontender to palpation.  No bilateral calf tenderness or swelling noted.  Moves all upper and lower extremities.  Labs are obtained and ordered to further evaluate patient.  CMP  with slight decrease in her potassium, will provide her with oral supplementation at this time.  Creatinine levels within normal limits, LFTs are at her baseline.  CBC with no leukocytosis, hemoglobin is slightly decreased, however this is that baseline.  She denies any melena, no hematemesis.  Chest x-ray without any pneumonia, pneumothorax, pleural effusion.  A D-dimer level was obtained as patient is currently on day 5 of COVID infection.  This is negative as well on today's visit.  EKG without any changes consistent with infarct.  Vitals are overall within normal limits.  We did discuss symptomatic treatment at home of her symptoms with continued supportive measurements.  She is agreeable of this at this time, return precautions discussed at length.  Patient stable for discharge.  Portions of this note were generated with Scientist, clinical (histocompatibility and immunogenetics). Dictation errors may occur despite best attempts at proofreading.  Final Clinical Impression(s) / ED Diagnoses Final diagnoses:  COVID-19 virus infection  Hypokalemia    Rx / DC Orders ED Discharge Orders          Ordered    potassium chloride SA (KLOR-CON) 20 MEQ tablet  Daily        12/05/20 1640             Claude Manges, PA-C 12/05/20 1640    Koleen Distance, MD 12/10/20 2328

## 2020-12-21 ENCOUNTER — Encounter (HOSPITAL_BASED_OUTPATIENT_CLINIC_OR_DEPARTMENT_OTHER): Payer: Self-pay | Admitting: Emergency Medicine

## 2020-12-21 ENCOUNTER — Other Ambulatory Visit: Payer: Self-pay

## 2020-12-21 ENCOUNTER — Emergency Department (HOSPITAL_BASED_OUTPATIENT_CLINIC_OR_DEPARTMENT_OTHER)
Admission: EM | Admit: 2020-12-21 | Discharge: 2020-12-21 | Disposition: A | Discharge status: Home / Self Care | Payer: Managed Care, Other (non HMO) | Attending: Emergency Medicine | Admitting: Emergency Medicine

## 2020-12-21 ENCOUNTER — Emergency Department (HOSPITAL_BASED_OUTPATIENT_CLINIC_OR_DEPARTMENT_OTHER): Payer: Managed Care, Other (non HMO)

## 2020-12-21 DIAGNOSIS — I10 Essential (primary) hypertension: Secondary | ICD-10-CM | POA: Insufficient documentation

## 2020-12-21 DIAGNOSIS — Z79899 Other long term (current) drug therapy: Secondary | ICD-10-CM | POA: Diagnosis not present

## 2020-12-21 DIAGNOSIS — Z7951 Long term (current) use of inhaled steroids: Secondary | ICD-10-CM | POA: Insufficient documentation

## 2020-12-21 DIAGNOSIS — R110 Nausea: Secondary | ICD-10-CM | POA: Insufficient documentation

## 2020-12-21 DIAGNOSIS — J4540 Moderate persistent asthma, uncomplicated: Secondary | ICD-10-CM | POA: Diagnosis not present

## 2020-12-21 DIAGNOSIS — H53149 Visual discomfort, unspecified: Secondary | ICD-10-CM | POA: Diagnosis not present

## 2020-12-21 DIAGNOSIS — R519 Headache, unspecified: Secondary | ICD-10-CM | POA: Diagnosis not present

## 2020-12-21 MED ORDER — METOCLOPRAMIDE HCL 5 MG/ML IJ SOLN
10.0000 mg | Freq: Once | INTRAMUSCULAR | Status: AC
  Administered 2020-12-21: 10 mg via INTRAVENOUS
  Filled 2020-12-21: qty 2

## 2020-12-21 MED ORDER — DIPHENHYDRAMINE HCL 50 MG/ML IJ SOLN
12.5000 mg | Freq: Once | INTRAMUSCULAR | Status: AC
  Administered 2020-12-21: 12.5 mg via INTRAVENOUS
  Filled 2020-12-21: qty 1

## 2020-12-21 MED ORDER — NAPROXEN 500 MG PO TABS: 500.0000 mg | ORAL_TABLET | Freq: Two times a day (BID) | ORAL | 0 refills | Status: AC

## 2020-12-21 MED ORDER — SODIUM CHLORIDE 0.9 % IV BOLUS
1000.0000 mL | Freq: Once | INTRAVENOUS | Status: AC
  Administered 2020-12-21: 1000 mL via INTRAVENOUS

## 2020-12-21 MED ORDER — KETOROLAC TROMETHAMINE 30 MG/ML IJ SOLN
30.0000 mg | Freq: Once | INTRAMUSCULAR | Status: AC
  Administered 2020-12-21: 30 mg via INTRAVENOUS
  Filled 2020-12-21: qty 1

## 2020-12-21 NOTE — ED Provider Notes (Signed)
[facility] EMERGENCY DEPT Provider Note   CSN: [id number] Arrival date & time: 12/21/20  1412     History Chief Complaint  Patient presents with   Headache    Laura Schaefer is a 45 y.o. female.  HPI   Pt is a 45 y/o female with a h/o asthma, depression, herpes, htn who presents to the ED today for eval of a headache that started about 4 days ago. Pain is located behind the bilat eyes and posterior head. She describes the pain as aching and throbbing. She has tried advil and tylenol with only mild relief. Pain rated 7/10.   This morning she had a 1-2 minute episode of tingling to left index finger. She further had some pain and achiness to the left trapezius muscle. Later on in the day she had an additional 1 minute episode of tingling to her entire face but mainly on the right side. These symptoms have since resolved. Reports some associated photophobia, nausea  Denies associated vision changes, vomiting, weakness.  She does report a history of migraines but has not had one in years.   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   Herpes    Hypertension    No pertinent past medical history     Patient Active Problem List   Diagnosis Date Noted   Bee sting reaction 11/26/2019   Moderate persistent asthma without complication 11/26/2019   Other allergic rhinitis 11/26/2019    Past Surgical History:  Procedure Laterality Date   toncillectomy     TONSILLECTOMY       OB History     Gravida  4   Para  3   Term  3   Preterm  0   AB  1   Living  3      SAB  0   IAB  0   Ectopic  0   Multiple  0   Live Births  3           Family History  Problem Relation Age of Onset   Hypertension Mother    Asthma Mother    Hypertension Father    Diabetes Sister    Hypertension Sister    Diabetes Paternal Grandmother    Hypertension Paternal Grandmother    Cancer Paternal  Grandfather     Social History   Tobacco Use   Smoking status: Never   Smokeless tobacco: Never  Vaping Use   Vaping Use: Never used  Substance Use Topics   Alcohol use: No   Drug use: No    Home Medications Prior to Admission medications   Medication Sig Start Date End Date Taking? Authorizing Provider  naproxen (NAPROSYN) 500 MG tablet Take 1 tablet (500 mg total) by mouth 2 (two) times daily for 7 days. 12/21/20 12/28/20 Yes Zarina Pe S, PA-C  albuterol (PROVENTIL,VENTOLIN) 90 MCG/ACT inhaler Inhale 2 puffs into the lungs every 6 (six) hours as needed.      [provider]  budesonide-formoterol (SYMBICORT) 160-4.5 MCG/ACT inhaler 2 puffs    [provider]  cyclobenzaprine (FLEXERIL) 5 MG tablet Take 1 tablet (5 mg total) by mouth at bedtime as needed for muscle spasms. 09/05/20   Nita Sickle K, DO  hydrochlorothiazide (MICROZIDE) 12.5 MG capsule Take 12.5 mg by mouth daily.    [provider]  potassium chloride SA (KLOR-CON) 20  MEQ tablet Take 1 tablet (20 mEq total) by mouth daily for 7 doses. 12/05/20 12/12/20  Claude Manges, PA-C  valACYclovir (VALTREX) 500 MG tablet  08/26/13   [provider]    Allergies    Penicillins  Review of Systems   Review of Systems  Constitutional:  Negative for fever.  HENT:  Negative for congestion, ear pain, rhinorrhea, sinus pressure, sinus pain and sore throat.   Eyes:  Positive for photophobia. Negative for pain and visual disturbance.  Respiratory:  Negative for cough and shortness of breath.   Cardiovascular:  Negative for chest pain.  Gastrointestinal:  Positive for nausea. Negative for abdominal pain, constipation, diarrhea and vomiting.  Genitourinary:  Negative for dysuria and hematuria.  Musculoskeletal:  Negative for back pain.  Skin:  Negative for rash.  Neurological:  Positive for headaches. Negative for dizziness, weakness, light-headedness and numbness.       Paresthesias (resolved)   All other systems reviewed and are negative.  Physical Exam Updated Vital Signs BP 130/79   Pulse 80   Temp 98.5 F (36.9 C)   Resp 16   Ht 5\' 6"  (1.676 m)   Wt 93.9 kg   LMP 11/19/2020 (Exact Date)   SpO2 100%   Breastfeeding No   BMI 33.41 kg/m   Physical Exam Vitals and nursing note reviewed.  Constitutional:      General: She is not in acute distress.    Appearance: She is well-developed.  HENT:     Head: Normocephalic and atraumatic.  Eyes:     Conjunctiva/sclera: Conjunctivae normal.  Cardiovascular:     Rate and Rhythm: Normal rate and regular rhythm.     Heart sounds: Normal heart sounds. No murmur heard. Pulmonary:     Effort: Pulmonary effort is normal. No respiratory distress.     Breath sounds: Normal breath sounds. No wheezing, rhonchi or rales.  Abdominal:     Palpations: Abdomen is soft.     Tenderness: There is no abdominal tenderness.  Musculoskeletal:     Cervical back: Neck supple.  Skin:    General: Skin is warm and dry.  Neurological:     Mental Status: She is alert.     Comments: Mental Status:  Alert, thought content appropriate, able to give a coherent history. Speech fluent without evidence of aphasia. Able to follow 2 step commands without difficulty.  Cranial Nerves:  II:  pupils equal, round, reactive to light III,IV, VI: ptosis not present, extra-ocular motions intact bilaterally  V,VII: smile symmetric, facial light touch sensation equal VIII: hearing grossly normal to voice  X: uvula elevates symmetrically  XI: bilateral shoulder shrug symmetric and strong XII: midline tongue extension without fassiculations Motor:  Normal tone. 5/5 strength of BUE and BLE major muscle groups including strong and equal grip strength and dorsiflexion/plantar flexion Sensory: light touch normal in all extremities. DTRs: biceps and achilles 2+ symmetric b/l Cerebellar: normal finger-to-nose with bilateral upper extremities      ED Results /  Procedures / Treatments   Labs (all labs ordered are listed, but only abnormal results are displayed) Labs Reviewed - No data to display  EKG None  Radiology CT HEAD WO CONTRAST (11/21/2020)  Result Date: 12/21/2020 CLINICAL DATA:  Headache EXAM: CT HEAD WITHOUT CONTRAST TECHNIQUE: Contiguous axial images were obtained from the base of the skull through the vertex without intravenous contrast. COMPARISON:  12/12/2009 FINDINGS: Brain: No evidence of acute infarction, hemorrhage, hydrocephalus, extra-axial collection or mass lesion/mass effect. Vascular: No  hyperdense vessel or unexpected calcification. Skull: Normal. Negative for fracture or focal lesion. Sinuses/Orbits: No acute finding. Other: None IMPRESSION: Negative non contrasted CT appearance of the brain Electronically Signed   By: Jasmine Pang M.D.   On: 12/21/2020 16:59    Procedures Procedures   Medications Ordered in ED Medications  ketorolac (TORADOL) 30 MG/ML injection 30 mg (30 mg Intravenous Given 12/21/20 1643)  sodium chloride 0.9 % bolus 1,000 mL (1,000 mLs Intravenous New Bag/Given 12/21/20 1642)  diphenhydrAMINE (BENADRYL) injection 12.5 mg (12.5 mg Intravenous Given 12/21/20 1642)  metoCLOPramide (REGLAN) injection 10 mg (10 mg Intravenous Given 12/21/20 1643)    ED Course  I have reviewed the triage vital signs and the nursing notes.  Pertinent labs & imaging results that were available during my care of the patient were reviewed by me and considered in my medical decision making (see chart for details).    MDM Rules/Calculators/A&P                          45 year old female presenting for evaluation of a headache that started a few days ago.  She had some associated bilateral facial numbness and left index finger tingling.  These episodes resolved within 1 minute and have not recurred.  Her neurologic exam is completely benign.  She was given a migraine cocktail in the ED and her symptoms resolved.  She felt improved on  reassessment.  CT scan of her head was negative and did not show any acute abnormalities.  I have low suspicion for any emergent neurologic process at this time that would warrant further work-up or admission at this time.  We will give an ambulatory referral to neurology given this is a new headache for her since experiencing COVID and she may need further work-up with.  Have advised on specific return precautions.  She voices understanding of the plan and reasons to return.  All questions answered, patient stable for discharge.   Final Clinical Impression(s) / ED Diagnoses Final diagnoses:  Acute nonintractable headache, unspecified headache type    Rx / DC Orders ED Discharge Orders          Ordered    Ambulatory referral to Neurology       Comments: An appointment is requested in approximately: 2 weeks   12/21/20 1746    naproxen (NAPROSYN) 500 MG tablet  2 times daily        12/21/20 1746             Shauntia Levengood S, PA-C 12/21/20 1747    Gloris Manchester, MD 12/22/20 [phone]

## 2020-12-21 NOTE — Discharge Instructions (Signed)

## 2020-12-21 NOTE — ED Triage Notes (Signed)
Pt woke up thurs morning with a headache, posterior and "behind her eyes" along with tingling in her left 2nd finger and face - primarily left side of face.

## 2020-12-21 NOTE — ED Triage Notes (Signed)
Pt took advil around 11am today, 400mg  with no relief.

## 2021-01-09 ENCOUNTER — Encounter: Payer: Self-pay | Admitting: Neurology

## 2021-01-09 ENCOUNTER — Ambulatory Visit (INDEPENDENT_AMBULATORY_CARE_PROVIDER_SITE_OTHER): Payer: Managed Care, Other (non HMO) | Admitting: Neurology

## 2021-01-09 ENCOUNTER — Other Ambulatory Visit: Payer: Self-pay

## 2021-01-09 VITALS — BP 125/75 | HR 83 | Ht 66.0 in | Wt 207.0 lb

## 2021-01-09 DIAGNOSIS — G43009 Migraine without aura, not intractable, without status migrainosus: Secondary | ICD-10-CM

## 2021-01-09 DIAGNOSIS — R200 Anesthesia of skin: Secondary | ICD-10-CM | POA: Diagnosis not present

## 2021-01-09 DIAGNOSIS — G5623 Lesion of ulnar nerve, bilateral upper limbs: Secondary | ICD-10-CM

## 2021-01-09 MED ORDER — SUMATRIPTAN SUCCINATE 50 MG PO TABS: 50.0000 mg | ORAL_TABLET | ORAL | 3 refills | Status: AC | PRN

## 2021-01-09 NOTE — Progress Notes (Signed)
Follow-up Visit   Date: 01/09/21   Laura Schaefer MRN: [id number] DOB: [birth year]   Interim History: Laura Schaefer is a 45 y.o. right-handed female with hypertension and asthma returning to the clinic for follow-up of neck pain, left leg numbness, hand numbness, and new complaints of headache.  The patient was accompanied to the clinic by self.   History of present illness: Patient also complains of neck pain and hand tingling. In late 2021, she had two episodes of waking up in the morning with left leg numbness, involving the entire leg.  She said the leg was so numb that she fell when getting out of bed.  Symptoms resolved within a few seconds. No weakness.  No face or left arm numbness. =She has not had spells in 2022.  She does not recall sleeping on it differently.   She complains of left sided neck pain, worse with neck rotation.  Pain is throbbing and achy.  She has not done physical therapy.  She takes Advil for pain which helps when she takes it.  She has tried many different pillows and a new mattress, but nothing gives her relief. She wakes up in the morning with her hands falling asleep, which often resolves with shaking her hands. No weakness in the arms.   She works in Designer, industrial/product.  UPDATE 01/09/2021:  She had COVID in early September and during this time was having severe headaches.  Late in September she went to the ER because of severe 4-day history of headache, associated with nasuea and light sensitivity.  She was given headache cocktail which resolved her symptoms.  Since then, her headaches have significant diminished.  She was given fioricet as needed, but has not taken this in the past 10 days.  Her neck pain is doing much better and self- resolved. She did not go to PT. She continues to wake up nightly with her hands falling alseep.  Her left leg continues to be tingling, always improve with repositioning.   Medications:  Current Outpatient Medications on File  Prior to Visit  Medication Sig Dispense Refill   albuterol (PROVENTIL,VENTOLIN) 90 MCG/ACT inhaler Inhale 2 puffs into the lungs every 6 (six) hours as needed.       budesonide-formoterol (SYMBICORT) 160-4.5 MCG/ACT inhaler 2 puffs     cyclobenzaprine (FLEXERIL) 5 MG tablet Take 1 tablet (5 mg total) by mouth at bedtime as needed for muscle spasms. 30 tablet 1   hydrochlorothiazide (MICROZIDE) 12.5 MG capsule Take 12.5 mg by mouth daily.     potassium chloride SA (KLOR-CON) 20 MEQ tablet Take 1 tablet (20 mEq total) by mouth daily for 7 doses. 7 tablet 0   valACYclovir (VALTREX) 500 MG tablet      No current facility-administered medications on file prior to visit.    Allergies:  Allergies  Allergen Reactions   Penicillins Hives and Palpitations    Vital Signs:  BP 125/75   Pulse 83   Ht 5\' 6"  (1.676 m)   Wt 207 lb (93.9 kg)   SpO2 99%   BMI 33.41 kg/m     Neurological Exam: MENTAL STATUS including orientation to time, place, person, recent and remote memory, attention span and concentration, language, and fund of knowledge is normal.  Speech is not dysarthric.  CRANIAL NERVES:  No visual field defects.  Pupils equal round and reactive to light.  Normal conjugate, extra-ocular eye movements in all directions of gaze.  No ptosis.  Face is  symmetric.   MOTOR:  Motor strength is 5/5 in all extremities.  No atrophy, fasciculations or abnormal movements.  No pronator drift.  Tone is normal.    MSRs:  Reflexes are 2+/4 throughout.  SENSORY:  Intact to vibration throughout.  COORDINATION/GAIT:  Normal finger-to- nose-finger.  Gait narrow based and stable.   Data: NCS/EMG of the arms 11/04/2020: Bilateral ulnar neuropathy with slowing across the elbow, demyelinating with axonal features, moderate. There is no evidence of carpal tunnel syndrome or cervical radiculopathy affecting the upper extremities.  IMPRESSION/PLAN:  Bilateral ulnar neuropathy across the elbow - Start using a  soft elbow pad  - Avoid leaning on elbow and over flexing at the elbow  2.  Cervicalgia, resolved  3.  Migraine  without aura  - Start imitrex 50mg  as needed for severe pain  4.  Left leg numbness, intermittent and imporved with repositioning  Return to clinic in 6 months.    Thank you for allowing me to participate in patient's care.  If I can answer any additional questions, I would be pleased to do so.    Sincerely,    Kartik Fernando K. , DO

## 2021-01-09 NOTE — Patient Instructions (Addendum)
Start imitrex 50mg  as needed for severe headache  Start using a soft elbow pad to avoid compression of the nerve.  At night time, flip the padding to serve as a block to prevent over bending at the elbow  Return to clinic in 4-6 months

## 2021-01-27 ENCOUNTER — Other Ambulatory Visit (HOSPITAL_COMMUNITY): Payer: Self-pay

## 2021-01-27 MED ORDER — ALBUTEROL SULFATE HFA 108 (90 BASE) MCG/ACT IN AERS
INHALATION_SPRAY | RESPIRATORY_TRACT | 5 refills | Status: AC
  Filled 2021-01-27: qty 18, 25d supply, fill #0

## 2021-01-30 ENCOUNTER — Other Ambulatory Visit (HOSPITAL_COMMUNITY): Payer: Self-pay

## 2021-02-12 ENCOUNTER — Other Ambulatory Visit: Payer: Self-pay | Admitting: Internal Medicine

## 2021-02-12 DIAGNOSIS — Z1231 Encounter for screening mammogram for malignant neoplasm of breast: Secondary | ICD-10-CM

## 2021-03-13 ENCOUNTER — Ambulatory Visit: Payer: Managed Care, Other (non HMO) | Admitting: Neurology

## 2021-03-18 ENCOUNTER — Ambulatory Visit
Admission: RE | Admit: 2021-03-18 | Discharge: 2021-03-18 | Disposition: A | Discharge status: Home / Self Care | Payer: Managed Care, Other (non HMO) | Source: Ambulatory Visit | Attending: Internal Medicine | Admitting: Internal Medicine

## 2021-03-18 DIAGNOSIS — Z1231 Encounter for screening mammogram for malignant neoplasm of breast: Secondary | ICD-10-CM

## 2021-07-16 ENCOUNTER — Encounter: Payer: Self-pay | Admitting: Neurology

## 2021-07-20 ENCOUNTER — Ambulatory Visit: Payer: Managed Care, Other (non HMO) | Admitting: Neurology

## 2021-08-04 NOTE — Progress Notes (Signed)
?Cardiology Office Note:   ? ?Date:  08/06/2021  ? ?ID:  Laura Schaefer, DOB 11/25/1975, MRN [id number] ? ?PCP:  Georgann Housekeeper, MD  ? ?[facility] Providers ?Cardiologist:  Alverda Skeans, MD ?Referring MD: Georgann Housekeeper, MD  ? ?Chief Complaint/Reason for Referral: Palpitations ? ?ASSESSMENT:   ? ?1. Palpitations   ? ? ?PLAN:   ? ?In order of problems listed above: ?1.  Palpitations: We will obtain echocardiogram and Holter monitor to evaluate further.  We will keep follow-up open-ended depending on results of this testing. ?  ? ?Dispo:  Return if symptoms worsen or fail to improve.  ? ?  ? ?Medication Adjustments/Labs and Tests Ordered: ?Current medicines are reviewed at length with the patient today.  Concerns regarding medicines are outlined above. ? ?The following changes have been made:  no change  ? ?Labs/tests ordered: ?Orders Placed This Encounter  ?Procedures  ? LONG TERM MONITOR (3-14 DAYS)  ? EKG 12-Lead  ? ECHOCARDIOGRAM COMPLETE  ? ? ?Medication Changes: ?No orders of the defined types were placed in this encounter. ? ? ? ?Current medicines are reviewed at length with the patient today.  The patient does not have concerns regarding medicines. ? ? ?History of Present Illness:   ? ?FOCUSED PROBLEM LIST:   ?1.  Hypertension ?2.  Asthma ? ? ?The patient is a 46 y.o. female with the indicated medical history here for recommendations regarding palpitations.  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  She tells me that almost on a daily basis she will get palpitations that last a few minutes.  They are not associate with chest pain, shortness of breath or lightheadedness.  There is no reproducible pattern to the palpitations.  They can occur when she is walking or when she is sitting.  She is required no emergency room visits or hospitalizations.  She is otherwise well without complaints.  She does not smoke or drink.  She occasionally has caffeine but this is limited to 1 can of Sprite. ? ?    ?Current Medications: ?Current Meds  ?Medication Sig  ? albuterol (PROVENTIL,VENTOLIN) 90 MCG/ACT inhaler Inhale 2 puffs into the lungs every 6 (six) hours as needed.    ? albuterol (VENTOLIN HFA) 108 (90 Base) MCG/ACT inhaler Inhale 2 puffs into the lungs every 4 hours as needed  ? budesonide-formoterol (SYMBICORT) 160-4.5 MCG/ACT inhaler 2 puffs  ? hydrochlorothiazide (MICROZIDE) 12.5 MG capsule Take 12.5 mg by mouth daily.  ? valACYclovir (VALTREX) 500 MG tablet   ?  ? ?Allergies:    ?Penicillins  ? ?Social History:   ?Social History  ? ?Tobacco Use  ? Smoking status: Never  ? Smokeless tobacco: Never  ?Vaping Use  ? Vaping Use: Never used  ?Substance Use Topics  ? Alcohol use: No  ? Drug use: No  ?  ? ?Family Hx: ?Family History  ?Problem Relation Age of Onset  ? Hypertension Mother   ? Asthma Mother   ? Hypertension Father   ? Diabetes Sister   ? Hypertension Sister   ? Diabetes Paternal Grandmother   ? Hypertension Paternal Grandmother   ? Cancer Paternal Grandfather   ?  ? ?Review of Systems:   ?Please see the history of present illness.    ?All other systems reviewed and are negative. ?  ? ? ?EKGs/Labs/Other Test Reviewed:   ? ?EKG:  EKG performed 2022 that I personally reviewed demonstrates sinus rhythm; EKG performed today that I personally reviewed demonstrates  sinus rhythm. ? ?Prior CV studies: ?Lower extremity Dopplers 2014 negative for DVT ? ?Other studies Reviewed: ?Review of the additional studies/records demonstrates: None relevant ? ?Recent Labs: ?12/05/2020: ALT 7; BUN 5; Creatinine, Ser 0.73; Hemoglobin 11.1; Platelets 349; Potassium 3.0; Sodium 139  ? ?Recent Lipid Panel ?No results found for: CHOL, TRIG, HDL, LDLCALC, LDLDIRECT ? ?Risk Assessment/Calculations:   ? ?  ?    ? ?Physical Exam:   ? ?VS:  BP 118/76   Pulse 67   Ht 5\' 6"  (1.676 m)   Wt 215 lb (97.5 kg)   LMP 07/20/2021   SpO2 97%   BMI 34.70 kg/m?    ?Wt Readings from Last 3 Encounters:  ?08/06/21 215 lb (97.5 kg)  ?01/09/21 207  lb (93.9 kg)  ?12/21/20 207 lb (93.9 kg)  ?  ?GENERAL:  No apparent distress, AOx3 ?HEENT:  No carotid bruits, +2 carotid impulses, no scleral icterus ?CAR: RRR no murmurs, gallops, rubs, or thrills ?RES:  Clear to auscultation bilaterally ?ABD:  Soft, nontender, nondistended, positive bowel sounds x 4 ?VASC:  +2 radial pulses, +2 carotid pulses, palpable pedal pulses ?NEURO:  CN 2-12 grossly intact; motor and sensory grossly intact ?PSYCH:  No active depression or anxiety ?EXT:  No edema, ecchymosis, or cyanosis ? ?Signed, ?12/23/20, MD  ?08/06/2021 10:00 AM    ?[facility] ?[address] [place], [place], [place]  [place] ?Phone: [phone]; Fax: [phone]  ? ?Note:  This document was prepared using Dragon voice recognition software and may include unintentional dictation errors. ?

## 2021-08-06 ENCOUNTER — Ambulatory Visit (INDEPENDENT_AMBULATORY_CARE_PROVIDER_SITE_OTHER): Payer: Managed Care, Other (non HMO) | Admitting: Internal Medicine

## 2021-08-06 ENCOUNTER — Ambulatory Visit (INDEPENDENT_AMBULATORY_CARE_PROVIDER_SITE_OTHER): Payer: Managed Care, Other (non HMO)

## 2021-08-06 VITALS — BP 118/76 | HR 67 | Ht 66.0 in | Wt 215.0 lb

## 2021-08-06 DIAGNOSIS — R002 Palpitations: Secondary | ICD-10-CM

## 2021-08-06 NOTE — Progress Notes (Unsigned)
Applied a 3 day Zio XT monitor to patient in the office 

## 2021-08-06 NOTE — Patient Instructions (Signed)
Medication Instructions:  ?No changes ?*If you need a refill on your cardiac medications before your next appointment, please call your pharmacy* ? ? ?Lab Work: ?none ?If you have labs (blood work) drawn today and your tests are completely normal, you will receive your results only by: ?MyChart Message (if you have MyChart) OR ?A paper copy in the mail ?If you have any lab test that is abnormal or we need to change your treatment, we will call you to review the results. ? ? ?Testing/Procedures: ?Your physician has requested that you have an echocardiogram. Echocardiography is a painless test that uses sound waves to create images of your heart. It provides your doctor with information about the size and shape of your heart and how well your heart?s chambers and valves are working. This procedure takes approximately one hour. There are no restrictions for this procedure. ? ?3 day Zio Heart monitor ? ? ?Follow-Up: ?As needed ? ?Important Information About Sugar ? ? ? ? ?  ?

## 2021-08-26 ENCOUNTER — Ambulatory Visit (HOSPITAL_COMMUNITY): Payer: Managed Care, Other (non HMO) | Attending: Cardiology

## 2021-08-26 DIAGNOSIS — R002 Palpitations: Secondary | ICD-10-CM | POA: Diagnosis present

## 2021-08-26 LAB — ECHOCARDIOGRAM COMPLETE
Area-P 1/2: 3.27 cm2
S' Lateral: 2.8 cm
Single Plane A4C EF: 70.6 %

## 2021-12-16 ENCOUNTER — Other Ambulatory Visit: Payer: Self-pay | Admitting: Obstetrics and Gynecology

## 2021-12-16 ENCOUNTER — Other Ambulatory Visit (HOSPITAL_COMMUNITY)
Admission: RE | Admit: 2021-12-16 | Discharge: 2021-12-16 | Disposition: A | Discharge status: Home / Self Care | Payer: Managed Care, Other (non HMO) | Source: Ambulatory Visit | Attending: Obstetrics and Gynecology | Admitting: Obstetrics and Gynecology

## 2021-12-16 DIAGNOSIS — Z01419 Encounter for gynecological examination (general) (routine) without abnormal findings: Secondary | ICD-10-CM | POA: Insufficient documentation

## 2021-12-22 LAB — CYTOLOGY - PAP
Comment: NEGATIVE
Diagnosis: UNDETERMINED — AB
High risk HPV: NEGATIVE

## 2022-03-24 ENCOUNTER — Ambulatory Visit (INDEPENDENT_AMBULATORY_CARE_PROVIDER_SITE_OTHER): Payer: Managed Care, Other (non HMO)

## 2022-03-24 ENCOUNTER — Encounter (HOSPITAL_BASED_OUTPATIENT_CLINIC_OR_DEPARTMENT_OTHER): Payer: Self-pay | Admitting: Family Medicine

## 2022-03-24 ENCOUNTER — Ambulatory Visit (INDEPENDENT_AMBULATORY_CARE_PROVIDER_SITE_OTHER): Payer: Managed Care, Other (non HMO) | Admitting: Family Medicine

## 2022-03-24 VITALS — BP 132/78 | HR 72 | Temp 97.6°F | Ht 66.0 in | Wt 211.2 lb

## 2022-03-24 DIAGNOSIS — M25561 Pain in right knee: Secondary | ICD-10-CM

## 2022-03-24 DIAGNOSIS — M25562 Pain in left knee: Secondary | ICD-10-CM

## 2022-03-24 DIAGNOSIS — G8929 Other chronic pain: Secondary | ICD-10-CM

## 2022-03-24 NOTE — Assessment & Plan Note (Signed)
Patient reports that she has been having bilateral knee pain for many years.  Left knee has been more painful than right.  Pain is primarily over anterior knee, occasionally she will have some pain/discomfort into left calf.  She has not had any associated swelling, no reported instability.  Pain will be worse with increased activity, walking, stairs.  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  She denies any history of left knee issues. On exam, Bilateral knee: No obvious deformity. No effusion.  Negative patellar grind.  Negative crepitus. Full ROM for flexion and extension.  Strength 5 out of 5 for flexion and extension. Anterior drawer: Negative Posterior drawer: Negative Lachman: Negative Varus stress test: Negative Valgus stress test: Negative McMurray's: Negative Thessaly: Negative Neurovascularly intact.  No evidence of lymphatic disease.  Exam overall benign in office today.  Based on history, suspect underlying patellofemoral pain syndrome, possible osteoarthritis.  Recommend proceeding with conservative measures including referral to physical therapy, home exercise program as per PT.  Can utilize OTC medications to help with pain control as needed.  Given duration of symptoms, can proceed with x-rays as well for further evaluation and to compare to prior x-rays from about 6 years ago to assess for any interval changes We can plan for follow-up in about 6 to 8 weeks to monitor progress with physical therapy, home exercises.  Discussed potential consideration for injection therapy based on response to above interventions

## 2022-03-24 NOTE — Progress Notes (Signed)
    Procedures performed today:    None.  Independent interpretation of notes and tests performed by another provider:   None.  Brief History, Exam, Impression, and Recommendations:    BP 132/78 (BP Location: Right Arm, Patient Position: Sitting, Cuff Size: Large)   Pulse 72   Temp 97.6 F (36.4 C) (Oral)   Ht 5\' 6"  (1.676 m)   Wt 211 lb 3.2 oz (95.8 kg)   SpO2 100%   BMI 34.09 kg/m   Bilateral knee pain Patient reports that she has been having bilateral knee pain for many years.  Left knee has been more painful than right.  Pain is primarily over anterior knee, occasionally she will have some pain/discomfort into left calf.  She has not had any associated swelling, no reported instability.  Pain will be worse with increased activity, walking, stairs.  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  She denies any history of left knee issues. On exam, Bilateral knee: No obvious deformity. No effusion.  Negative patellar grind.  Negative crepitus. Full ROM for flexion and extension.  Strength 5 out of 5 for flexion and extension. Anterior drawer: Negative Posterior drawer: Negative Lachman: Negative Varus stress test: Negative Valgus stress test: Negative McMurray's: Negative Thessaly: Negative Neurovascularly intact.  No evidence of lymphatic disease.  Exam overall benign in office today.  Based on history, suspect underlying patellofemoral pain syndrome, possible osteoarthritis.  Recommend proceeding with conservative measures including referral to physical therapy, home exercise program as per PT.  Can utilize OTC medications to help with pain control as needed.  Given duration of symptoms, can proceed with x-rays as well for further evaluation and to compare to prior x-rays from about 6 years ago to assess for any interval changes We can plan for follow-up in about 6 to 8 weeks to monitor progress with physical therapy, home exercises.   Discussed potential consideration for injection therapy based on response to above interventions  Return in about 8 weeks (around 05/19/2022).   ___________________________________________ Merton Wadlow de 05/21/2022, MD, ABFM, CAQSM Primary Care and Sports Medicine [facility]

## 2022-03-31 ENCOUNTER — Other Ambulatory Visit: Payer: Self-pay | Admitting: Internal Medicine

## 2022-03-31 DIAGNOSIS — Z1231 Encounter for screening mammogram for malignant neoplasm of breast: Secondary | ICD-10-CM

## 2022-04-26 ENCOUNTER — Ambulatory Visit: Payer: Managed Care, Other (non HMO) | Attending: Family Medicine

## 2022-04-26 ENCOUNTER — Other Ambulatory Visit: Payer: Self-pay

## 2022-04-26 DIAGNOSIS — M6281 Muscle weakness (generalized): Secondary | ICD-10-CM | POA: Diagnosis present

## 2022-04-26 DIAGNOSIS — R2689 Other abnormalities of gait and mobility: Secondary | ICD-10-CM | POA: Insufficient documentation

## 2022-04-26 DIAGNOSIS — M25562 Pain in left knee: Secondary | ICD-10-CM | POA: Insufficient documentation

## 2022-04-26 DIAGNOSIS — G8929 Other chronic pain: Secondary | ICD-10-CM | POA: Insufficient documentation

## 2022-04-26 DIAGNOSIS — M25561 Pain in right knee: Secondary | ICD-10-CM | POA: Insufficient documentation

## 2022-04-26 NOTE — Therapy (Signed)
OUTPATIENT PHYSICAL THERAPY LOWER EXTREMITY EVALUATION   Patient Name: Laura Schaefer MRN: [id number] DOB:[birth year], 47 y.o., female Today's Date: 04/26/2022  END OF SESSION:  PT End of Session - 04/26/22 0827     Visit Number 1    Number of Visits 12    Date for PT Re-Evaluation 06/07/22    Authorization Type [employer]    PT Start Time 0750    PT Stop Time 0823    PT Time Calculation (min) 33 min    Activity Tolerance Patient tolerated treatment well    Behavior During Therapy [facility] for tasks assessed/performed             Past Medical History:  Diagnosis Date   Allergic rhinitis    Angio-edema    Anxiety    Asthma    Asthma    BMI 38.0-38.9,adult    Chronic tension headaches    Depression    PPD with first baby   GERD without esophagitis    Gout    Herpes    Hypertension    Iron deficiency    No pertinent past medical history    Obesity    Paresthesia    Prediabetes    Vitamin D deficiency    Past Surgical History:  Procedure Laterality Date   toncillectomy     TONSILLECTOMY     Patient Active Problem List   Diagnosis Date Noted   Bilateral knee pain 03/24/2022   Bee sting reaction 11/26/2019   Moderate persistent asthma without complication 11/26/2019   Other allergic rhinitis 11/26/2019    PCP: Georgann Housekeeper, MD  REFERRING PROVIDER: de [place], Raymond J, MD  REFERRING DIAG: M25.561,M25.562,G89.29 (ICD-10-CM) - Chronic pain of both knees   THERAPY DIAG:  Chronic pain of right knee - Plan: PT plan of care cert/re-cert  Muscle weakness (generalized) - Plan: PT plan of care cert/re-cert  Other abnormalities of gait and mobility - Plan: PT plan of care cert/re-cert  Rationale for Evaluation and Treatment: Rehabilitation  ONSET DATE: Chronic  SUBJECTIVE:   SUBJECTIVE STATEMENT: Pt presents to PT with reports of chronic knee pain, R>L. Denies trauma or MOI, notes pain increases greatly with prolonged sitting, such as a plane ride or movie  theater. Denies N/T down either LE, has pain in L tibial area occasionally.   PERTINENT HISTORY: HTN, Depression, Anxiety   PAIN:  Are you having pain?  No: NPRS scale: 0/10 Worst: 6/10 Pain location: R medial  Pain description: dull ache Aggravating factors: prolonged sitting Relieving factors: none  PRECAUTIONS: None  WEIGHT BEARING RESTRICTIONS: No  FALLS:  Has patient fallen in last 6 months? No  LIVING ENVIRONMENT: Lives with: lives with their family Lives in: House/apartment  OCCUPATION: [employer]  PLOF: Independent  PATIENT GOALS: decrease pain to improve comfort with work and travel  OBJECTIVE:   DIAGNOSTIC FINDINGS:   N/A  PATIENT SURVEYS:  FOTO: 77% function; 79% predicted   COGNITION: Overall cognitive status: Within functional limits for tasks assessed     SENSATION: WFL  POSTURE: rounded shoulders and forward head; genu valgus  PALPATION: TTP to R medial knee joint line  LOWER EXTREMITY ROM:  Active ROM Right eval Left eval  Hip flexion    Hip extension    Hip abduction    Hip adduction    Hip internal rotation    Hip external rotation    Knee flexion WNL WNL  Knee extension WNL WNL  Ankle dorsiflexion    Ankle plantarflexion  Ankle inversion    Ankle eversion     (Blank rows = not tested)  LOWER EXTREMITY MMT:  MMT Right eval Left eval  Hip flexion 5/5 5/5  Hip extension    Hip abduction 4/5 4/5  Hip adduction    Hip internal rotation    Hip external rotation    Knee flexion 4/5 4/5  Knee extension 5/5 5/5  Ankle dorsiflexion    Ankle plantarflexion    Ankle inversion    Ankle eversion     (Blank rows = not tested)  LOWER EXTREMITY SPECIAL TESTS:  Knee special tests: Lachman Test: negative  FUNCTIONAL TESTS:  30 Second STS: 11 reps  GAIT: Distance walked: 68ft Assistive device utilized: None Level of assistance: Complete Independence Comments: no overt deviations   TREATMENT: [facility] Adult [facility]  Treatment:                                                DATE: 04/26/2022 Therapeutic Exercise: Quad sets x 10 - 5" hold SLR x 10 each S/L clamshell x 15 GTB each S/L hip abd x 10 each  PATIENT EDUCATION:  Education details: eval findings, FOTO, HEP, POC Person educated: Patient Education method: Explanation, Demonstration, and Handouts Education comprehension: verbalized understanding and returned demonstration  HOME EXERCISE PROGRAM: Access Code: [email or web address] URL: [email or web address] Date: 04/26/2022 Prepared by: Octavio Manns  Exercises - Supine Quadricep Sets  - 1 x daily - 7 x weekly - 2 sets - 10 reps - 5 sec hold - Active Straight Leg Raise with Quad Set  - 1 x daily - 7 x weekly - 2 sets - 10 reps - Clamshell with Resistance  - 1 x daily - 7 x weekly - 2 sets - 15 reps - green band hold - Sidelying Hip Abduction  - 1 x daily - 7 x weekly - 2 sets - 10 reps  ASSESSMENT:  CLINICAL IMPRESSION: Patient is a 47 y.o. F who was seen today for physical therapy evaluation and treatment for chronic bilateral knee pain, R>L. Physical findings are consistent with MD impression as pt has palpable pain in medial R knee joint line and decrease in proximal hip strength. Her FOTO score shows she is slightly operating below PLOF. Pt would benefit from skilled PT services working on improving quad and proximal hip strength in order to decrease pain and improve comfort.   OBJECTIVE IMPAIRMENTS: decreased mobility, difficulty walking, decreased strength, and pain.   ACTIVITY LIMITATIONS: standing, squatting, stairs, and locomotion level  PARTICIPATION LIMITATIONS: driving, shopping, community activity, and occupation  PERSONAL FACTORS: Time since onset of injury/illness/exacerbation and 1-2 comorbidities: HTN, Depression, Anxiety   are also affecting patient's functional outcome.   REHAB POTENTIAL: Excellent  CLINICAL DECISION MAKING: Stable/uncomplicated  EVALUATION  COMPLEXITY: Low   GOALS: Goals reviewed with patient? No  SHORT TERM GOALS: Target date: 05/17/2022   Pt will be compliant and knowledgeable with initial HEP for improved comfort and carryover Baseline: initial HEP given  Goal status: INITIAL  2.  Pt will self report knee pain no greater than 3/10 for improved comfort and functional ability Baseline: 6/10 at worst Goal status: INITIAL    LONG TERM GOALS: Target date: 06/21/2022  Pt will improve FOTO function score to no less than 79% as proxy for functional improvement Baseline: 77% function Goal status: INITIAL   2.  Pt will self report knee pain no greater than 1/10 for improved comfort and functional ability Baseline: 6/10 at worst Goal status: INITIAL   3.  Pt will increase 30 Second Sit to Stand rep count to no less than 13 reps for improved balance, strength, and functional mobility Baseline: 11 reps  Goal status: INITIAL   4.  Pt will improve all LE MMT to no less than 5/5 for improved comfort and functional mobility Baseline: see chart Goal status: INITIAL   PLAN:  PT FREQUENCY: 1-2x/week  PT DURATION: 6 weeks  PLANNED INTERVENTIONS: Therapeutic exercises, Therapeutic activity, Neuromuscular re-education, Balance training, Gait training, Patient/Family education, Self Care, Joint mobilization, Dry Needling, Electrical stimulation, Cryotherapy, Moist heat, Manual therapy, and Re-evaluation  PLAN FOR NEXT SESSION: assess HEP response, progress strength as able   Ward Chatters, PT 04/26/2022, 8:30 AM

## 2022-05-04 ENCOUNTER — Ambulatory Visit (HOSPITAL_BASED_OUTPATIENT_CLINIC_OR_DEPARTMENT_OTHER): Payer: Managed Care, Other (non HMO) | Admitting: Family Medicine

## 2022-05-05 ENCOUNTER — Ambulatory Visit (HOSPITAL_BASED_OUTPATIENT_CLINIC_OR_DEPARTMENT_OTHER): Payer: Managed Care, Other (non HMO) | Admitting: Family Medicine

## 2022-05-19 ENCOUNTER — Ambulatory Visit
Admission: RE | Admit: 2022-05-19 | Discharge: 2022-05-19 | Disposition: A | Discharge status: Home / Self Care | Payer: Managed Care, Other (non HMO) | Source: Ambulatory Visit | Attending: Internal Medicine | Admitting: Internal Medicine

## 2022-05-19 DIAGNOSIS — Z1231 Encounter for screening mammogram for malignant neoplasm of breast: Secondary | ICD-10-CM

## 2022-07-13 ENCOUNTER — Other Ambulatory Visit: Payer: Self-pay | Admitting: Physician Assistant

## 2022-07-13 ENCOUNTER — Ambulatory Visit
Admission: RE | Admit: 2022-07-13 | Discharge: 2022-07-13 | Disposition: A | Discharge status: Home / Self Care | Payer: Managed Care, Other (non HMO) | Source: Ambulatory Visit | Attending: Physician Assistant | Admitting: Physician Assistant

## 2022-07-13 DIAGNOSIS — J45901 Unspecified asthma with (acute) exacerbation: Secondary | ICD-10-CM

## 2023-02-09 ENCOUNTER — Other Ambulatory Visit (HOSPITAL_COMMUNITY): Payer: Self-pay | Admitting: Internal Medicine

## 2023-02-09 DIAGNOSIS — K219 Gastro-esophageal reflux disease without esophagitis: Secondary | ICD-10-CM

## 2023-02-17 ENCOUNTER — Other Ambulatory Visit (HOSPITAL_COMMUNITY): Payer: Self-pay | Admitting: Internal Medicine

## 2023-02-17 ENCOUNTER — Ambulatory Visit (HOSPITAL_COMMUNITY)
Admission: RE | Admit: 2023-02-17 | Discharge: 2023-02-17 | Disposition: A | Discharge status: Home / Self Care | Payer: Managed Care, Other (non HMO) | Source: Ambulatory Visit | Attending: Internal Medicine | Admitting: Internal Medicine

## 2023-02-17 DIAGNOSIS — K219 Gastro-esophageal reflux disease without esophagitis: Secondary | ICD-10-CM | POA: Diagnosis present

## 2023-04-12 DIAGNOSIS — K219 Gastro-esophageal reflux disease without esophagitis: Secondary | ICD-10-CM | POA: Diagnosis not present

## 2023-04-12 DIAGNOSIS — R1319 Other dysphagia: Secondary | ICD-10-CM | POA: Diagnosis not present

## 2023-04-12 DIAGNOSIS — D649 Anemia, unspecified: Secondary | ICD-10-CM | POA: Diagnosis not present

## 2023-04-12 DIAGNOSIS — R933 Abnormal findings on diagnostic imaging of other parts of digestive tract: Secondary | ICD-10-CM | POA: Diagnosis not present

## 2023-04-13 DIAGNOSIS — M549 Dorsalgia, unspecified: Secondary | ICD-10-CM | POA: Diagnosis not present

## 2023-05-16 DIAGNOSIS — M545 Low back pain, unspecified: Secondary | ICD-10-CM | POA: Diagnosis not present

## 2023-05-16 DIAGNOSIS — M5416 Radiculopathy, lumbar region: Secondary | ICD-10-CM | POA: Diagnosis not present

## 2023-05-17 DIAGNOSIS — R933 Abnormal findings on diagnostic imaging of other parts of digestive tract: Secondary | ICD-10-CM | POA: Diagnosis not present

## 2023-05-17 DIAGNOSIS — R1310 Dysphagia, unspecified: Secondary | ICD-10-CM | POA: Diagnosis not present

## 2023-05-17 DIAGNOSIS — K3189 Other diseases of stomach and duodenum: Secondary | ICD-10-CM | POA: Diagnosis not present

## 2023-05-17 DIAGNOSIS — K2930 Chronic superficial gastritis without bleeding: Secondary | ICD-10-CM | POA: Diagnosis not present

## 2023-05-27 ENCOUNTER — Other Ambulatory Visit: Payer: Self-pay | Admitting: Internal Medicine

## 2023-05-27 DIAGNOSIS — Z1231 Encounter for screening mammogram for malignant neoplasm of breast: Secondary | ICD-10-CM

## 2023-06-02 ENCOUNTER — Ambulatory Visit
Admission: RE | Admit: 2023-06-02 | Discharge: 2023-06-02 | Disposition: A | Discharge status: Home / Self Care | Payer: Managed Care, Other (non HMO) | Source: Ambulatory Visit | Attending: Internal Medicine | Admitting: Internal Medicine

## 2023-06-02 DIAGNOSIS — Z1231 Encounter for screening mammogram for malignant neoplasm of breast: Secondary | ICD-10-CM

## 2023-06-02 DIAGNOSIS — M545 Low back pain, unspecified: Secondary | ICD-10-CM | POA: Diagnosis not present

## 2023-06-10 ENCOUNTER — Other Ambulatory Visit (HOSPITAL_COMMUNITY)
Admission: RE | Admit: 2023-06-10 | Discharge: 2023-06-10 | Disposition: A | Discharge status: Home / Self Care | Source: Ambulatory Visit | Attending: Obstetrics and Gynecology | Admitting: Obstetrics and Gynecology

## 2023-06-10 DIAGNOSIS — N393 Stress incontinence (female) (male): Secondary | ICD-10-CM | POA: Diagnosis not present

## 2023-06-10 DIAGNOSIS — Z01419 Encounter for gynecological examination (general) (routine) without abnormal findings: Secondary | ICD-10-CM | POA: Diagnosis not present

## 2023-06-14 DIAGNOSIS — I10 Essential (primary) hypertension: Secondary | ICD-10-CM | POA: Diagnosis not present

## 2023-06-14 LAB — CYTOLOGY - PAP
Comment: NEGATIVE
Diagnosis: NEGATIVE
High risk HPV: NEGATIVE

## 2023-07-04 DIAGNOSIS — M545 Low back pain, unspecified: Secondary | ICD-10-CM | POA: Diagnosis not present

## 2023-07-05 DIAGNOSIS — I10 Essential (primary) hypertension: Secondary | ICD-10-CM | POA: Diagnosis not present

## 2023-07-05 DIAGNOSIS — R519 Headache, unspecified: Secondary | ICD-10-CM | POA: Diagnosis not present

## 2023-08-01 DIAGNOSIS — K219 Gastro-esophageal reflux disease without esophagitis: Secondary | ICD-10-CM | POA: Diagnosis not present

## 2023-08-01 DIAGNOSIS — R1319 Other dysphagia: Secondary | ICD-10-CM | POA: Diagnosis not present

## 2023-08-09 DIAGNOSIS — R7303 Prediabetes: Secondary | ICD-10-CM | POA: Diagnosis not present

## 2023-08-09 DIAGNOSIS — J4520 Mild intermittent asthma, uncomplicated: Secondary | ICD-10-CM | POA: Diagnosis not present

## 2023-08-09 DIAGNOSIS — K219 Gastro-esophageal reflux disease without esophagitis: Secondary | ICD-10-CM | POA: Diagnosis not present

## 2023-08-09 DIAGNOSIS — I10 Essential (primary) hypertension: Secondary | ICD-10-CM | POA: Diagnosis not present

## 2023-08-25 DIAGNOSIS — B029 Zoster without complications: Secondary | ICD-10-CM | POA: Diagnosis not present

## 2023-11-15 DIAGNOSIS — I10 Essential (primary) hypertension: Secondary | ICD-10-CM | POA: Diagnosis not present

## 2023-11-15 DIAGNOSIS — R519 Headache, unspecified: Secondary | ICD-10-CM | POA: Diagnosis not present

## 2023-11-24 DIAGNOSIS — R202 Paresthesia of skin: Secondary | ICD-10-CM | POA: Diagnosis not present

## 2023-11-24 DIAGNOSIS — F419 Anxiety disorder, unspecified: Secondary | ICD-10-CM | POA: Diagnosis not present

## 2023-11-24 DIAGNOSIS — R519 Headache, unspecified: Secondary | ICD-10-CM | POA: Diagnosis not present

## 2023-11-24 DIAGNOSIS — D509 Iron deficiency anemia, unspecified: Secondary | ICD-10-CM | POA: Diagnosis not present

## 2023-11-24 DIAGNOSIS — R5383 Other fatigue: Secondary | ICD-10-CM | POA: Diagnosis not present

## 2023-11-25 ENCOUNTER — Other Ambulatory Visit (HOSPITAL_BASED_OUTPATIENT_CLINIC_OR_DEPARTMENT_OTHER): Payer: Self-pay | Admitting: Internal Medicine

## 2023-11-25 ENCOUNTER — Ambulatory Visit (HOSPITAL_BASED_OUTPATIENT_CLINIC_OR_DEPARTMENT_OTHER)
Admission: RE | Admit: 2023-11-25 | Discharge: 2023-11-25 | Disposition: A | Discharge status: Home / Self Care | Source: Ambulatory Visit | Attending: Internal Medicine | Admitting: Internal Medicine

## 2023-11-25 DIAGNOSIS — H938X3 Other specified disorders of ear, bilateral: Secondary | ICD-10-CM | POA: Insufficient documentation

## 2023-11-25 DIAGNOSIS — R519 Headache, unspecified: Secondary | ICD-10-CM | POA: Insufficient documentation

## 2023-12-02 DIAGNOSIS — J4520 Mild intermittent asthma, uncomplicated: Secondary | ICD-10-CM | POA: Diagnosis not present

## 2023-12-02 DIAGNOSIS — R059 Cough, unspecified: Secondary | ICD-10-CM | POA: Diagnosis not present

## 2023-12-02 DIAGNOSIS — R0609 Other forms of dyspnea: Secondary | ICD-10-CM | POA: Diagnosis not present

## 2023-12-29 DIAGNOSIS — N915 Oligomenorrhea, unspecified: Secondary | ICD-10-CM | POA: Diagnosis not present

## 2023-12-29 DIAGNOSIS — N951 Menopausal and female climacteric states: Secondary | ICD-10-CM | POA: Diagnosis not present

## 2024-01-06 DIAGNOSIS — Z23 Encounter for immunization: Secondary | ICD-10-CM | POA: Diagnosis not present

## 2024-02-09 DIAGNOSIS — E559 Vitamin D deficiency, unspecified: Secondary | ICD-10-CM | POA: Diagnosis not present

## 2024-02-09 DIAGNOSIS — D509 Iron deficiency anemia, unspecified: Secondary | ICD-10-CM | POA: Diagnosis not present

## 2024-02-09 DIAGNOSIS — Z0000 Encounter for general adult medical examination without abnormal findings: Secondary | ICD-10-CM | POA: Diagnosis not present

## 2024-02-09 DIAGNOSIS — R7303 Prediabetes: Secondary | ICD-10-CM | POA: Diagnosis not present

## 2024-03-28 DIAGNOSIS — R0609 Other forms of dyspnea: Secondary | ICD-10-CM | POA: Diagnosis not present

## 2024-03-28 DIAGNOSIS — K219 Gastro-esophageal reflux disease without esophagitis: Secondary | ICD-10-CM | POA: Diagnosis not present

## 2024-03-28 DIAGNOSIS — M940 Chondrocostal junction syndrome [Tietze]: Secondary | ICD-10-CM | POA: Diagnosis not present

## 2024-03-30 ENCOUNTER — Encounter: Payer: Self-pay | Admitting: Internal Medicine

## 2024-04-04 ENCOUNTER — Other Ambulatory Visit (HOSPITAL_COMMUNITY): Payer: Self-pay | Admitting: Internal Medicine

## 2024-04-04 DIAGNOSIS — R0609 Other forms of dyspnea: Secondary | ICD-10-CM

## 2024-04-05 ENCOUNTER — Telehealth (HOSPITAL_COMMUNITY): Payer: Self-pay | Admitting: *Deleted

## 2024-04-05 NOTE — Telephone Encounter (Signed)
 Reaching out to patient to offer assistance regarding upcoming cardiac imaging study; pt verbalizes understanding of appt date/time, parking situation and where to check in, pre-test NPO status and medications ordered, and verified current allergies; name and call back number provided for further questions should they arise Sid Seats RN Navigator Cardiac Imaging [facility] [phone] office [phone] cell

## 2024-04-06 ENCOUNTER — Ambulatory Visit (HOSPITAL_COMMUNITY)
Admission: RE | Admit: 2024-04-06 | Discharge: 2024-04-06 | Disposition: A | Source: Ambulatory Visit | Attending: Internal Medicine | Admitting: Internal Medicine

## 2024-04-06 DIAGNOSIS — R0609 Other forms of dyspnea: Secondary | ICD-10-CM | POA: Diagnosis present

## 2024-04-06 MED ORDER — NITROGLYCERIN 0.4 MG SL SUBL
0.8000 mg | SUBLINGUAL_TABLET | Freq: Once | SUBLINGUAL | Status: AC
  Administered 2024-04-06: 0.8 mg via SUBLINGUAL

## 2024-04-06 MED ORDER — IOHEXOL 350 MG/ML SOLN
100.0000 mL | Freq: Once | INTRAVENOUS | Status: AC | PRN
  Administered 2024-04-06: 100 mL via INTRAVENOUS
